# Patient Record
Sex: Female | Born: 1935 | Race: White | Hispanic: No | Smoking: Never smoker
Health system: Southern US, Community
[De-identification: ages and names within clinical notes are randomized; demographics above are authoritative.]

## PROBLEM LIST (undated history)

## (undated) DIAGNOSIS — Z5189 Encounter for other specified aftercare: Secondary | ICD-10-CM

## (undated) DIAGNOSIS — IMO0001 Reserved for inherently not codable concepts without codable children: Secondary | ICD-10-CM

## (undated) DIAGNOSIS — M869 Osteomyelitis, unspecified: Secondary | ICD-10-CM

## (undated) DIAGNOSIS — M1990 Unspecified osteoarthritis, unspecified site: Secondary | ICD-10-CM

## (undated) DIAGNOSIS — N289 Disorder of kidney and ureter, unspecified: Secondary | ICD-10-CM

## (undated) DIAGNOSIS — E785 Hyperlipidemia, unspecified: Secondary | ICD-10-CM

## (undated) HISTORY — PX: TONSILLECTOMY: SUR1361

## (undated) HISTORY — PX: BLADDER SURGERY: SHX569

## (undated) HISTORY — PX: TOTAL HIP ARTHROPLASTY: SHX124

## (undated) HISTORY — PX: EYE SURGERY: SHX253

## (undated) HISTORY — PX: JOINT REPLACEMENT: SHX530

## (undated) HISTORY — PX: TUBAL LIGATION: SHX77

## (undated) HISTORY — DX: Hyperlipidemia, unspecified: E78.5

---

## 2003-12-24 ENCOUNTER — Emergency Department (HOSPITAL_COMMUNITY): Admission: EM | Admit: 2003-12-24 | Discharge: 2003-12-24 | Payer: Self-pay | Admitting: Emergency Medicine

## 2004-01-01 ENCOUNTER — Emergency Department (HOSPITAL_COMMUNITY): Admission: EM | Admit: 2004-01-01 | Discharge: 2004-01-01 | Payer: Self-pay | Admitting: Emergency Medicine

## 2004-01-04 ENCOUNTER — Emergency Department (HOSPITAL_COMMUNITY): Admission: EM | Admit: 2004-01-04 | Discharge: 2004-01-04 | Payer: Self-pay | Admitting: Emergency Medicine

## 2004-08-04 ENCOUNTER — Ambulatory Visit: Payer: Self-pay | Admitting: Internal Medicine

## 2004-08-28 ENCOUNTER — Emergency Department (HOSPITAL_COMMUNITY): Admission: EM | Admit: 2004-08-28 | Discharge: 2004-08-28 | Payer: Self-pay | Admitting: Emergency Medicine

## 2004-11-15 ENCOUNTER — Ambulatory Visit: Payer: Self-pay | Admitting: Internal Medicine

## 2005-09-05 ENCOUNTER — Ambulatory Visit: Payer: Self-pay | Admitting: Internal Medicine

## 2006-04-12 ENCOUNTER — Ambulatory Visit: Payer: Self-pay | Admitting: Internal Medicine

## 2006-04-26 ENCOUNTER — Ambulatory Visit: Payer: Self-pay | Admitting: Internal Medicine

## 2006-08-29 ENCOUNTER — Ambulatory Visit: Payer: Self-pay | Admitting: Internal Medicine

## 2007-11-06 ENCOUNTER — Ambulatory Visit: Payer: Self-pay | Admitting: Internal Medicine

## 2007-11-06 DIAGNOSIS — J309 Allergic rhinitis, unspecified: Secondary | ICD-10-CM | POA: Insufficient documentation

## 2007-11-06 DIAGNOSIS — M948X9 Other specified disorders of cartilage, unspecified sites: Secondary | ICD-10-CM | POA: Insufficient documentation

## 2007-11-06 DIAGNOSIS — R631 Polydipsia: Secondary | ICD-10-CM

## 2007-11-08 LAB — CONVERTED CEMR LAB
BUN: 12 mg/dL (ref 6–23)
Basophils Absolute: 0 10*3/uL (ref 0.0–0.1)
Basophils Relative: 0.2 % (ref 0.0–1.0)
CO2: 36 meq/L — ABNORMAL HIGH (ref 19–32)
Calcium: 9.5 mg/dL (ref 8.4–10.5)
Chloride: 100 meq/L (ref 96–112)
Creatinine, Ser: 0.8 mg/dL (ref 0.4–1.2)
Eosinophils Absolute: 0 10*3/uL (ref 0.0–0.7)
Eosinophils Relative: 0.8 % (ref 0.0–5.0)
GFR calc Af Amer: 91 mL/min
GFR calc non Af Amer: 75 mL/min
Glucose, Bld: 135 mg/dL — ABNORMAL HIGH (ref 70–99)
HCT: 42.6 % (ref 36.0–46.0)
Hemoglobin: 14 g/dL (ref 12.0–15.0)
Lymphocytes Relative: 32.8 % (ref 12.0–46.0)
MCHC: 32.8 g/dL (ref 30.0–36.0)
MCV: 94.5 fL (ref 78.0–100.0)
Monocytes Absolute: 0.5 10*3/uL (ref 0.1–1.0)
Monocytes Relative: 8.7 % (ref 3.0–12.0)
Neutro Abs: 3.4 10*3/uL (ref 1.4–7.7)
Neutrophils Relative %: 57.5 % (ref 43.0–77.0)
Platelets: 269 10*3/uL (ref 150–400)
Potassium: 4 meq/L (ref 3.5–5.1)
RBC: 4.5 M/uL (ref 3.87–5.11)
RDW: 12.8 % (ref 11.5–14.6)
Sodium: 142 meq/L (ref 135–145)
TSH: 1.06 microintl units/mL (ref 0.35–5.50)
WBC: 5.8 10*3/uL (ref 4.5–10.5)

## 2008-06-30 ENCOUNTER — Ambulatory Visit: Payer: Self-pay | Admitting: Endocrinology

## 2008-07-04 ENCOUNTER — Encounter: Payer: Self-pay | Admitting: Endocrinology

## 2008-08-29 ENCOUNTER — Ambulatory Visit: Payer: Self-pay | Admitting: Internal Medicine

## 2008-08-29 DIAGNOSIS — M79609 Pain in unspecified limb: Secondary | ICD-10-CM

## 2008-08-29 DIAGNOSIS — M25569 Pain in unspecified knee: Secondary | ICD-10-CM

## 2008-08-29 DIAGNOSIS — H6120 Impacted cerumen, unspecified ear: Secondary | ICD-10-CM

## 2008-09-09 ENCOUNTER — Ambulatory Visit: Payer: Self-pay | Admitting: Internal Medicine

## 2008-09-09 DIAGNOSIS — N814 Uterovaginal prolapse, unspecified: Secondary | ICD-10-CM | POA: Insufficient documentation

## 2008-09-09 DIAGNOSIS — N760 Acute vaginitis: Secondary | ICD-10-CM | POA: Insufficient documentation

## 2008-10-06 ENCOUNTER — Telehealth: Payer: Self-pay | Admitting: Internal Medicine

## 2008-10-08 ENCOUNTER — Other Ambulatory Visit: Admission: RE | Admit: 2008-10-08 | Discharge: 2008-10-08 | Payer: Self-pay | Admitting: Obstetrics and Gynecology

## 2008-10-08 ENCOUNTER — Ambulatory Visit: Payer: Self-pay | Admitting: Obstetrics and Gynecology

## 2008-10-08 ENCOUNTER — Encounter: Payer: Self-pay | Admitting: Internal Medicine

## 2008-10-08 ENCOUNTER — Encounter: Payer: Self-pay | Admitting: Obstetrics and Gynecology

## 2008-10-10 ENCOUNTER — Ambulatory Visit: Payer: Self-pay | Admitting: Women's Health

## 2008-12-03 ENCOUNTER — Ambulatory Visit: Payer: Self-pay | Admitting: Women's Health

## 2009-01-26 ENCOUNTER — Ambulatory Visit: Payer: Self-pay | Admitting: Internal Medicine

## 2009-01-26 ENCOUNTER — Telehealth: Payer: Self-pay | Admitting: Internal Medicine

## 2009-01-26 DIAGNOSIS — R0989 Other specified symptoms and signs involving the circulatory and respiratory systems: Secondary | ICD-10-CM

## 2009-01-26 DIAGNOSIS — R5381 Other malaise: Secondary | ICD-10-CM | POA: Insufficient documentation

## 2009-01-26 DIAGNOSIS — R9431 Abnormal electrocardiogram [ECG] [EKG]: Secondary | ICD-10-CM

## 2009-01-26 DIAGNOSIS — R5383 Other fatigue: Secondary | ICD-10-CM

## 2009-01-26 DIAGNOSIS — R0609 Other forms of dyspnea: Secondary | ICD-10-CM | POA: Insufficient documentation

## 2009-01-27 ENCOUNTER — Ambulatory Visit: Payer: Self-pay | Admitting: Internal Medicine

## 2009-01-30 ENCOUNTER — Telehealth: Payer: Self-pay | Admitting: Internal Medicine

## 2009-01-30 ENCOUNTER — Ambulatory Visit: Payer: Self-pay | Admitting: Obstetrics and Gynecology

## 2009-02-13 ENCOUNTER — Encounter: Admission: RE | Admit: 2009-02-13 | Discharge: 2009-02-13 | Payer: Self-pay | Admitting: Chiropractic Medicine

## 2010-08-16 ENCOUNTER — Encounter
Admission: RE | Admit: 2010-08-16 | Discharge: 2010-08-16 | Payer: Self-pay | Source: Home / Self Care | Attending: Internal Medicine | Admitting: Internal Medicine

## 2010-08-21 ENCOUNTER — Encounter: Payer: Self-pay | Admitting: Internal Medicine

## 2010-08-29 LAB — CONVERTED CEMR LAB
BUN: 15 mg/dL (ref 6–23)
Basophils Absolute: 0 10*3/uL (ref 0.0–0.1)
Chloride: 100 meq/L (ref 96–112)
Eosinophils Absolute: 0.1 10*3/uL (ref 0.0–0.7)
GFR calc non Af Amer: 87.45 mL/min (ref >60)
Glucose, Bld: 92 mg/dL (ref 70–99)
HCT: 41.9 % (ref 36.0–46.0)
Lymphs Abs: 1.2 10*3/uL (ref 0.7–4.0)
MCHC: 35 g/dL (ref 30.0–36.0)
MCV: 93.1 fL (ref 78.0–100.0)
Monocytes Absolute: 0.4 10*3/uL (ref 0.1–1.0)
Platelets: 211 10*3/uL (ref 150.0–400.0)
Potassium: 3.3 meq/L — ABNORMAL LOW (ref 3.5–5.1)
RDW: 12.5 % (ref 11.5–14.6)
Sed Rate: 9 mm/hr (ref 0–22)
Specific Gravity, Urine: <=1.005 (ref 1.000–1.030)
TSH: 0.89 microintl units/mL (ref 0.35–5.50)
Total Bilirubin: 0.9 mg/dL (ref 0.3–1.2)
Urobilinogen, UA: 0.2 (ref 0.0–1.0)

## 2010-12-14 NOTE — Assessment & Plan Note (Signed)
[facility]                                 ON-CALL NOTE   Kendra Snyder, Kendra Snyder                   MRN:          [id number]  DATE:08/30/2008                            DOB:          08-28-1934    PHONE NUMBER:  [phone].   Dr. Posey Rea and Dr. Ladona Ridgel on call.   CHIEF COMPLAINT:  Ear problem.   The patient states she saw Dr. Posey Rea for ear problem yesterday.  He  brought out some of ear wax and that helped quite a bit.  He gave her a  prescription for Corticosporin ear drops, but she refuses to use it,  because she thinks she is allergic to all antibiotics.  This morning,  her ear has been bothering her a bit for a small amount of time, 15-20  minutes, the front side of her face felt somewhat numb.  She rubbed it  and it got better fairly quickly.  She said she has not had any loss of  motor control and dropping, problems with speech, headache, or any other  symptoms whatsoever.  She has had sinus problems lately but those are  much better.  I advised her that if her numbness returns and if she  notices any weakness or drooping in her face or if she has speech  problems or any other neurologic symptoms, she needs to go to the  emergency room for evaluation.  Otherwise, we will continue to watch  closely this again.  I advised her to call back or seek care if she  develops a fever or her ear pain becomes severe, and otherwise to call  Dr. Loren Racer office for a followup on Monday.     Marne A. Tower, MD  Electronically Signed    MAT/MedQ  DD: 08/30/2008  DT: 08/31/2008  Job #: [id number]

## 2010-12-17 NOTE — Assessment & Plan Note (Signed)
[facility]                           PRIMARY CARE OFFICE NOTE   Kendra Snyder, Kendra Snyder                   MRN:          [id number]  DATE:08/29/2006                            DOB:          1935-02-15    PROCEDURE:  Left ear irrigation.   INDICATIONS FOR PROCEDURE:  Severe wax impaction.   The procedure was discussed with the patient in detail and she agrees to  proceed.   The ear was irrigated with lukewarm water. After a while a large amount  of wax was recovered. Tolerated well.   COMPLICATIONS:  None. Hearing restored.     Georgina Quint. Plotnikov, MD  Electronically Signed    AVP/MedQ  DD: 08/30/2006  DT: 08/30/2006  Job #: [id number]

## 2011-01-14 ENCOUNTER — Inpatient Hospital Stay (HOSPITAL_COMMUNITY)
Admission: AD | Admit: 2011-01-14 | Discharge: 2011-01-14 | Disposition: A | Discharge status: Home / Self Care | Payer: Medicare Other | Source: Ambulatory Visit | Attending: Obstetrics and Gynecology | Admitting: Obstetrics and Gynecology

## 2011-01-14 DIAGNOSIS — Z96649 Presence of unspecified artificial hip joint: Secondary | ICD-10-CM | POA: Insufficient documentation

## 2011-01-14 DIAGNOSIS — R339 Retention of urine, unspecified: Secondary | ICD-10-CM | POA: Insufficient documentation

## 2011-01-14 LAB — URINE MICROSCOPIC-ADD ON

## 2011-01-14 LAB — URINALYSIS, ROUTINE W REFLEX MICROSCOPIC
Glucose, UA: NEGATIVE mg/dL
Hgb urine dipstick: NEGATIVE
Ketones, ur: NEGATIVE mg/dL
Protein, ur: NEGATIVE mg/dL
pH: 7 (ref 5.0–8.0)

## 2011-04-01 ENCOUNTER — Encounter: Payer: Self-pay | Admitting: Emergency Medicine

## 2011-04-01 ENCOUNTER — Emergency Department (HOSPITAL_BASED_OUTPATIENT_CLINIC_OR_DEPARTMENT_OTHER)
Admission: EM | Admit: 2011-04-01 | Discharge: 2011-04-01 | Disposition: A | Discharge status: Home / Self Care | Payer: Medicare Other | Attending: Emergency Medicine | Admitting: Emergency Medicine

## 2011-04-01 DIAGNOSIS — R339 Retention of urine, unspecified: Secondary | ICD-10-CM | POA: Insufficient documentation

## 2011-04-01 DIAGNOSIS — N8111 Cystocele, midline: Secondary | ICD-10-CM | POA: Insufficient documentation

## 2011-04-01 DIAGNOSIS — I10 Essential (primary) hypertension: Secondary | ICD-10-CM | POA: Insufficient documentation

## 2011-04-01 DIAGNOSIS — N8110 Cystocele, unspecified: Secondary | ICD-10-CM

## 2011-04-01 HISTORY — DX: Disorder of kidney and ureter, unspecified: N28.9

## 2011-04-01 HISTORY — DX: Encounter for other specified aftercare: Z51.89

## 2011-04-01 HISTORY — DX: Reserved for inherently not codable concepts without codable children: IMO0001

## 2011-04-01 HISTORY — DX: Osteomyelitis, unspecified: M86.9

## 2011-04-01 LAB — URINALYSIS, ROUTINE W REFLEX MICROSCOPIC
Glucose, UA: NEGATIVE mg/dL
Ketones, ur: NEGATIVE mg/dL
Nitrite: NEGATIVE
Protein, ur: NEGATIVE mg/dL
Urobilinogen, UA: 0.2 mg/dL (ref 0.0–1.0)

## 2011-04-01 LAB — DIFFERENTIAL
Basophils Absolute: 0 10*3/uL (ref 0.0–0.1)
Basophils Relative: 1 % (ref 0–1)
Eosinophils Absolute: 0.1 10*3/uL (ref 0.0–0.7)
Eosinophils Relative: 1 % (ref 0–5)
Monocytes Absolute: 0.6 10*3/uL (ref 0.1–1.0)

## 2011-04-01 LAB — CBC
HCT: 40.4 % (ref 36.0–46.0)
MCH: 31.8 pg (ref 26.0–34.0)
MCHC: 35.1 g/dL (ref 30.0–36.0)
MCV: 90.6 fL (ref 78.0–100.0)
Platelets: 231 10*3/uL (ref 150–400)
RDW: 13.1 % (ref 11.5–15.5)
WBC: 6.3 10*3/uL (ref 4.0–10.5)

## 2011-04-01 LAB — URINE MICROSCOPIC-ADD ON

## 2011-04-01 LAB — BASIC METABOLIC PANEL
CO2: 32 mEq/L (ref 19–32)
Calcium: 9.5 mg/dL (ref 8.4–10.5)
Creatinine, Ser: 0.5 mg/dL (ref 0.50–1.10)
GFR calc non Af Amer: >60 mL/min (ref >60)
Sodium: 142 mEq/L (ref 135–145)

## 2011-04-01 NOTE — ED Provider Notes (Signed)
History     CSN: [id number] Arrival date & time: 04/01/2011  7:04 PM  Chief Complaint  Patient presents with  . Urinary Retention    Patient states she has a dropped bladder.    HPI Comments: 34yoF h/o bladder prolapse pw urinary retention. Per patient she usually wears a pessary, it was removed by her OBGYN 5 days ago 2/2 vaginal sores. States she began to have lower abdominal pressure and incomplete voiding which began yesterday. She denies back pain, abdominal pain/n/v/fever/chills. Denies other complaints at this time. Seen at [facility] for similar in June with retention relieved by pessary replacement. Last urinated here before my history and physical. States she's worried about her kidneys. Denies hematuria/dysuria. Denies vaginal discharge/bleeding/irritation       Kendra Snyder 04/01/2011 18:52     Patient states she is having problems with her pessarry. States that her doctor has ordered her a new one at a different size but has not received it yet.         Past Medical History  Diagnosis Date  . Hypertension   . Renal disorder   . Blood transfusion   . Osteomyelitis     Past Surgical History  Procedure Date  . Bladder surgery   . Total hip arthroplasty   . Tubal ligation   . Eye surgery   . Tonsillectomy     No family history on file.  History  Substance Use Topics  . Smoking status: Never Smoker   . Smokeless tobacco: Not on file  . Alcohol Use: No    OB History    Grav Para Term Preterm Abortions TAB SAB Ect Mult Living                  Review of Systems  All other systems reviewed and are negative.  except as noted HPI   Physical Exam  BP 154/73  Pulse 85  Temp(Src) 98.8 F (37.1 C) (Oral)  Resp 18  Ht 4\' 10"  (1.473 m)  Wt 121 lb (54.885 kg)  BMI 25.29 kg/m2  SpO2 99%  Physical Exam  Nursing note and vitals reviewed. Constitutional: She is oriented to person, place, and time. She appears well-developed.  HENT:  Head: Atraumatic.    Mouth/Throat: Oropharynx is clear and moist.  Eyes: Conjunctivae and EOM are normal. Pupils are equal, round, and reactive to light.  Neck: Normal range of motion. Neck supple.  Cardiovascular: Normal rate, regular rhythm, normal heart sounds and intact distal pulses.   Pulmonary/Chest: Effort normal and breath sounds normal. No respiratory distress. She has no wheezes. She has no rales.  Abdominal: Soft. She exhibits no distension. There is tenderness. There is no rebound and no guarding.       Mild suprapubic ttp No CVAT  Musculoskeletal: Normal range of motion.  Neurological: She is alert and oriented to person, place, and time.  Skin: Skin is warm and dry. No rash noted.  Psychiatric: She has a normal mood and affect.   Results for orders placed during the hospital encounter of 04/01/11  URINALYSIS, ROUTINE W REFLEX MICROSCOPIC      Component Value Range   Color, Urine YELLOW  YELLOW    Appearance CLOUDY (*) CLEAR    Specific Gravity, Urine 1.009  1.005 - 1.030    pH 7.5  5.0 - 8.0    Glucose, UA NEGATIVE  NEGATIVE (mg/dL)   Hgb urine dipstick NEGATIVE  NEGATIVE    Bilirubin Urine NEGATIVE  NEGATIVE  Ketones, ur NEGATIVE  NEGATIVE (mg/dL)   Protein, ur NEGATIVE  NEGATIVE (mg/dL)   Urobilinogen, UA 0.2  0.0 - 1.0 (mg/dL)   Nitrite NEGATIVE  NEGATIVE    Leukocytes, UA MODERATE (*) NEGATIVE   URINE MICROSCOPIC-ADD ON      Component Value Range   Squamous Epithelial / LPF FEW (*) RARE    WBC, UA 7-10  <3 (WBC/hpf)   Bacteria, UA FEW (*) RARE   CBC      Component Value Range   WBC 6.3  4.0 - 10.5 (K/uL)   RBC 4.46  3.87 - 5.11 (MIL/uL)   Hemoglobin 14.2  12.0 - 15.0 (g/dL)   HCT 16.1  09.6 - 04.5 (%)   MCV 90.6  78.0 - 100.0 (fL)   MCH 31.8  26.0 - 34.0 (pg)   MCHC 35.1  30.0 - 36.0 (g/dL)   RDW 40.9  81.1 - 91.4 (%)   Platelets 231  150 - 400 (K/uL)  DIFFERENTIAL      Component Value Range   Neutrophils Relative 70  43 - 77 (%)   Neutro Abs 4.4  1.7 - 7.7 (K/uL)    Lymphocytes Relative 20  12 - 46 (%)   Lymphs Abs 1.3  0.7 - 4.0 (K/uL)   Monocytes Relative 9  3 - 12 (%)   Monocytes Absolute 0.6  0.1 - 1.0 (K/uL)   Eosinophils Relative 1  0 - 5 (%)   Eosinophils Absolute 0.1  0.0 - 0.7 (K/uL)   Basophils Relative 1  0 - 1 (%)   Basophils Absolute 0.0  0.0 - 0.1 (K/uL)     ED Course  Procedures  MDM 76yoF h/o prolapsed bladder pw inability to completely void x 2 days after pessary removal by her GYN for vaginal lesions. Will check Cr, U/A. She is urinating here. Reassess  Kendra Gaul, MD  9:49 PM  Labs reviewed and unremarkable. +LE and only 7-10 WBC, +Squams. Will not treat at this time. Pt with 200cc post void residual. Feeling better. Refusing foley catheter until GYN f/u. States she's better here and does not want anything else done. Will call her GYN for earlier appt. Precautions for return.    Kendra Cellar, MD 04/01/11 2152

## 2011-04-01 NOTE — ED Notes (Signed)
Straight cath performed using aseptic technique to assess post cath residual. Pt had urinated prior to procedure. approx returned.

## 2011-04-01 NOTE — ED Notes (Signed)
Discharge condition entered in on wrong patient.

## 2011-04-01 NOTE — ED Notes (Signed)
Patient states she is having problems with her pessarry. States that her doctor has ordered her a new one at a different size but has not received it yet.

## 2011-04-03 LAB — URINE CULTURE

## 2013-09-28 ENCOUNTER — Encounter (HOSPITAL_COMMUNITY): Payer: Self-pay | Admitting: Emergency Medicine

## 2013-09-28 ENCOUNTER — Inpatient Hospital Stay (HOSPITAL_COMMUNITY)
Admission: EM | Admit: 2013-09-28 | Discharge: 2013-10-06 | DRG: 470 | Disposition: A | Discharge status: Skilled Nursing Facility | Payer: Medicare HMO | Attending: Internal Medicine | Admitting: Internal Medicine

## 2013-09-28 ENCOUNTER — Emergency Department (HOSPITAL_COMMUNITY): Payer: Medicare HMO

## 2013-09-28 DIAGNOSIS — Z9181 History of falling: Secondary | ICD-10-CM

## 2013-09-28 DIAGNOSIS — M169 Osteoarthritis of hip, unspecified: Principal | ICD-10-CM | POA: Diagnosis present

## 2013-09-28 DIAGNOSIS — M79609 Pain in unspecified limb: Secondary | ICD-10-CM | POA: Diagnosis present

## 2013-09-28 DIAGNOSIS — R5383 Other fatigue: Secondary | ICD-10-CM

## 2013-09-28 DIAGNOSIS — M1612 Unilateral primary osteoarthritis, left hip: Secondary | ICD-10-CM | POA: Diagnosis present

## 2013-09-28 DIAGNOSIS — M25559 Pain in unspecified hip: Secondary | ICD-10-CM | POA: Diagnosis present

## 2013-09-28 DIAGNOSIS — R531 Weakness: Secondary | ICD-10-CM | POA: Diagnosis present

## 2013-09-28 DIAGNOSIS — R195 Other fecal abnormalities: Secondary | ICD-10-CM | POA: Diagnosis present

## 2013-09-28 DIAGNOSIS — E8809 Other disorders of plasma-protein metabolism, not elsewhere classified: Secondary | ICD-10-CM | POA: Diagnosis present

## 2013-09-28 DIAGNOSIS — R609 Edema, unspecified: Secondary | ICD-10-CM | POA: Diagnosis present

## 2013-09-28 DIAGNOSIS — R5381 Other malaise: Secondary | ICD-10-CM | POA: Diagnosis present

## 2013-09-28 DIAGNOSIS — M25552 Pain in left hip: Secondary | ICD-10-CM | POA: Diagnosis present

## 2013-09-28 DIAGNOSIS — M25569 Pain in unspecified knee: Secondary | ICD-10-CM | POA: Diagnosis present

## 2013-09-28 DIAGNOSIS — M25562 Pain in left knee: Secondary | ICD-10-CM | POA: Diagnosis present

## 2013-09-28 DIAGNOSIS — M899 Disorder of bone, unspecified: Secondary | ICD-10-CM | POA: Diagnosis present

## 2013-09-28 DIAGNOSIS — M8580 Other specified disorders of bone density and structure, unspecified site: Secondary | ICD-10-CM | POA: Diagnosis present

## 2013-09-28 DIAGNOSIS — R600 Localized edema: Secondary | ICD-10-CM | POA: Diagnosis present

## 2013-09-28 DIAGNOSIS — Z91041 Radiographic dye allergy status: Secondary | ICD-10-CM

## 2013-09-28 DIAGNOSIS — Z96649 Presence of unspecified artificial hip joint: Secondary | ICD-10-CM

## 2013-09-28 DIAGNOSIS — Z880 Allergy status to penicillin: Secondary | ICD-10-CM

## 2013-09-28 DIAGNOSIS — N390 Urinary tract infection, site not specified: Secondary | ICD-10-CM | POA: Diagnosis present

## 2013-09-28 DIAGNOSIS — M949 Disorder of cartilage, unspecified: Secondary | ICD-10-CM

## 2013-09-28 DIAGNOSIS — E876 Hypokalemia: Secondary | ICD-10-CM | POA: Diagnosis present

## 2013-09-28 DIAGNOSIS — M1610 Unilateral primary osteoarthritis, unspecified hip: Principal | ICD-10-CM | POA: Diagnosis present

## 2013-09-28 DIAGNOSIS — Z881 Allergy status to other antibiotic agents status: Secondary | ICD-10-CM

## 2013-09-28 DIAGNOSIS — W19XXXA Unspecified fall, initial encounter: Secondary | ICD-10-CM | POA: Diagnosis present

## 2013-09-28 HISTORY — DX: Unspecified osteoarthritis, unspecified site: M19.90

## 2013-09-28 LAB — URINALYSIS, ROUTINE W REFLEX MICROSCOPIC
Bilirubin Urine: NEGATIVE
Glucose, UA: NEGATIVE mg/dL
Hgb urine dipstick: NEGATIVE
KETONES UR: NEGATIVE mg/dL
NITRITE: NEGATIVE
PROTEIN: NEGATIVE mg/dL
Specific Gravity, Urine: 1.005 (ref 1.005–1.030)
UROBILINOGEN UA: 0.2 mg/dL (ref 0.0–1.0)
pH: 7 (ref 5.0–8.0)

## 2013-09-28 LAB — CBC WITH DIFFERENTIAL/PLATELET
BASOS ABS: 0 10*3/uL (ref 0.0–0.1)
Basophils Relative: 0 % (ref 0–1)
EOS PCT: 0 % (ref 0–5)
Eosinophils Absolute: 0 10*3/uL (ref 0.0–0.7)
HCT: 37.7 % (ref 36.0–46.0)
Hemoglobin: 12.9 g/dL (ref 12.0–15.0)
LYMPHS PCT: 12 % (ref 12–46)
Lymphs Abs: 0.9 10*3/uL (ref 0.7–4.0)
MCH: 30.9 pg (ref 26.0–34.0)
MCHC: 34.2 g/dL (ref 30.0–36.0)
MCV: 90.2 fL (ref 78.0–100.0)
Monocytes Absolute: 0.9 10*3/uL (ref 0.1–1.0)
Monocytes Relative: 12 % (ref 3–12)
Neutro Abs: 5.7 10*3/uL (ref 1.7–7.7)
Neutrophils Relative %: 75 % (ref 43–77)
PLATELETS: 277 10*3/uL (ref 150–400)
RBC: 4.18 MIL/uL (ref 3.87–5.11)
RDW: 13.2 % (ref 11.5–15.5)
WBC: 7.6 10*3/uL (ref 4.0–10.5)

## 2013-09-28 LAB — COMPREHENSIVE METABOLIC PANEL
ALBUMIN: 3.4 g/dL — AB (ref 3.5–5.2)
ALT: 17 U/L (ref 0–35)
AST: 22 U/L (ref 0–37)
Alkaline Phosphatase: 115 U/L (ref 39–117)
BILIRUBIN TOTAL: 0.4 mg/dL (ref 0.3–1.2)
BUN: 19 mg/dL (ref 6–23)
CALCIUM: 9.6 mg/dL (ref 8.4–10.5)
CO2: 29 meq/L (ref 19–32)
Chloride: 100 mEq/L (ref 96–112)
Creatinine, Ser: 0.56 mg/dL (ref 0.50–1.10)
GFR calc non Af Amer: 88 mL/min — ABNORMAL LOW (ref >90)
Glucose, Bld: 111 mg/dL — ABNORMAL HIGH (ref 70–99)
Potassium: 3.4 mEq/L — ABNORMAL LOW (ref 3.7–5.3)
SODIUM: 140 meq/L (ref 137–147)
TOTAL PROTEIN: 6.4 g/dL (ref 6.0–8.3)

## 2013-09-28 LAB — TROPONIN I

## 2013-09-28 LAB — URINE MICROSCOPIC-ADD ON

## 2013-09-28 LAB — CK: Total CK: 162 U/L (ref 7–177)

## 2013-09-28 MED ORDER — KETOROLAC TROMETHAMINE 15 MG/ML IJ SOLN
15.0000 mg | Freq: Once | INTRAMUSCULAR | Status: AC
  Administered 2013-09-28: 15 mg via INTRAVENOUS
  Filled 2013-09-28: qty 1

## 2013-09-28 MED ORDER — ENOXAPARIN SODIUM 40 MG/0.4ML ~~LOC~~ SOLN
40.0000 mg | Freq: Every day | SUBCUTANEOUS | Status: DC
  Administered 2013-09-28 – 2013-09-29 (×2): 40 mg via SUBCUTANEOUS
  Filled 2013-09-28 (×3): qty 0.4

## 2013-09-28 MED ORDER — POTASSIUM CHLORIDE CRYS ER 20 MEQ PO TBCR
30.0000 meq | EXTENDED_RELEASE_TABLET | Freq: Once | ORAL | Status: AC
  Administered 2013-09-28: 30 meq via ORAL
  Filled 2013-09-28: qty 1

## 2013-09-28 MED ORDER — CIPROFLOXACIN IN D5W 400 MG/200ML IV SOLN
400.0000 mg | Freq: Two times a day (BID) | INTRAVENOUS | Status: DC
  Administered 2013-09-28 – 2013-10-02 (×8): 400 mg via INTRAVENOUS
  Filled 2013-09-28 (×10): qty 200

## 2013-09-28 MED ORDER — CIPROFLOXACIN IN D5W 400 MG/200ML IV SOLN
400.0000 mg | Freq: Once | INTRAVENOUS | Status: AC
Start: 2013-09-28 — End: 2013-09-28
  Administered 2013-09-28: 400 mg via INTRAVENOUS
  Filled 2013-09-28: qty 200

## 2013-09-28 MED ORDER — SODIUM CHLORIDE 0.9 % IV BOLUS (SEPSIS)
500.0000 mL | Freq: Once | INTRAVENOUS | Status: AC
  Administered 2013-09-28: 500 mL via INTRAVENOUS

## 2013-09-28 NOTE — ED Notes (Addendum)
EMS reports pt called due to pain all over her body due to arthritis, no other complaints. Pt would benefit for social work consult due to living by herself, she has home health but thinks they can only come to assist M-F.

## 2013-09-28 NOTE — ED Notes (Signed)
Bed: [id number] Expected date:  Expected time:  Means of arrival:  Comments: EMS Rheumatoid arthritis pain

## 2013-09-28 NOTE — ED Notes (Signed)
MD Sheldon at bedside.  

## 2013-09-28 NOTE — Evaluation (Signed)
Physical Therapy Evaluation Patient Details Name: Kendra Snyder MRN: [id number] DOB: 01/03/1935 Today's Date: 09/28/2013 Time: [id number] PT Time Calculation (min): 45 min  PT Assessment / Plan / Recommendation History of Present Illness  fall at home  Clinical Impression  Pt with large amount of incontinent urine in bed.  C/o L shoulder pain with minimal ROM. Pt is very limited in mobility and c/o pain of legs. Pt will benefit from PT to address problems listed.    PT Assessment  Patient needs continued PT services    Follow Up Recommendations  SNF;Supervision/Assistance - 24 hour    Does the patient have the potential to tolerate intense rehabilitation      Barriers to Discharge Decreased caregiver support      Equipment Recommendations  None recommended by PT    Recommendations for Other Services     Frequency Min 3X/week    Precautions / Restrictions Precautions Precaution Comments: incontinent, large amounts, use depends if available   Pertinent Vitals/Pain Legs are painful, L shoulder very painful .      Mobility  Bed Mobility Overal bed mobility: +2 for physical assistance;+ 2 for safety/equipment;Needs Assistance Bed Mobility: Rolling;Supine to Sit;Sit to Supine Rolling: +2 for physical assistance;+2 for safety/equipment;Total assist Supine to sit: Total assist;+2 for physical assistance;+2 for safety/equipment Sit to supine: Total assist;+2 for safety/equipment;+2 for physical assistance General bed mobility comments: pt moves  as a unit, very rigid in legs and trunk. Transfers General transfer comment: unable to attempt, bed too high and pt not  functioning well enough.    Exercises     PT Diagnosis: Generalized weakness;Acute pain  PT Problem List: Decreased strength;Decreased range of motion;Decreased activity tolerance;Decreased balance;Decreased mobility;Pain;Decreased knowledge of use of DME;Decreased safety awareness;Decreased knowledge of  precautions PT Treatment Interventions: DME instruction;Functional mobility training;Therapeutic activities;Therapeutic exercise;Patient/family education     PT Goals(Current goals can be found in the care plan section) Acute Rehab PT Goals Patient Stated Goal: i want to go home PT Goal Formulation: With patient Time For Goal Achievement: 10/12/13 Potential to Achieve Goals: Fair  Visit Information  Last PT Received On: 09/28/13 Assistance Needed: +2 History of Present Illness: fall at home       Prior Functioning  Home Living Family/patient expects to be discharged to:: Skilled nursing facility Living Arrangements: Alone Prior Function Level of Independence: Needs assistance Gait / Transfers Assistance Needed: got around in apt on rollator, fell from it. Comments: apppears to be marginally taking care of self, pt reports community supports her,    Cognition  Cognition Arousal/Alertness: Awake/alert Behavior During Therapy: WFL for tasks assessed/performed Overall Cognitive Status: Difficult to assess    Extremity/Trunk Assessment Upper Extremity Assessment Upper Extremity Assessment: LUE deficits/detail LUE Deficits / Details: severe  functional limits of shoulder elevation, could not tolerate movung arm away from her side. Lower Extremity Assessment Lower Extremity Assessment: RLE deficits/detail;LLE deficits/detail RLE Deficits / Details: reddened feet/legs, hyper sensitive LLE Deficits / Details: decreased knee flexion tolerated., noted very reddened feet and up legs, decreased knee flexion tolerated. Cervical / Trunk Assessment Cervical / Trunk Assessment: Kyphotic   Balance Balance Overall balance assessment: Needs assistance Sitting-balance support: Feet unsupported;No upper extremity supported Sitting balance-Leahy Scale: Poor Postural control: Posterior lean;Left lateral lean  End of Session PT - End of Session Activity Tolerance: Patient limited by  pain Patient left: in bed;with call bell/phone within reach;with family/visitor present Nurse Communication: Mobility status  GP     Rada HayHill, Lindsey Demonte Elizabeth 09/28/2013, 4:50  PM   

## 2013-09-28 NOTE — ED Notes (Signed)
Pt transported to CT ?

## 2013-09-28 NOTE — ED Notes (Signed)
Pt reports that she fell last pm around 11pm. She is having pain all over and pain in the left upper arm with movement. She is not c/o any additional pain from the fall. She states that she was on the floor from her fall for 4 hours until she was able to reach her cell phone to call a neighbor for help. Pt has bilateral lower extremity swelling and reports that has been present x 6 months.

## 2013-09-28 NOTE — ED Provider Notes (Signed)
CSN: [id number]     Arrival date & time 09/28/13  0720 History   First MD Initiated Contact with Patient 09/28/13 [phone]     Chief Complaint  Patient presents with  . Arthritis  . Fall     (Consider location/radiation/quality/duration/timing/severity/associated sxs/prior Treatment) Patient is a 79 y.o. female presenting with arthritis and fall.  Arthritis  Fall   Level 5 caveat due to vague historian Pt with history of arthritis (unsure what kind) reports she had a fall last night around 11pm. She was on the floor for several hours until she was able to call a neighbor who helped her onto her couch. A short time later, she tried to get up and fell again. Spent several more hours on the floor before neighbor came back and helped her up again. At some point EMS was called but it isn't clear who called or why. The patient states she is constantly in pain, but denies any new injuries from her fall. She has refused any medications or consideration of surgery to help with her arthritis in the past but has 'a group' that comes to her house to help her. I'm not clear exactly what services this group provides but seems to be physical therapy of some sort and maybe home health nursing as well. Pt is unable to further describe what they do except that they got her a bedside commode and were working on getting her a motorized wheelchair. In the ED today she 'just wants to be able to walk again so she can go home' but she recognizes she won't be able to go home if she is unable to stand and walk safely.   Past Medical History  Diagnosis Date  . Hypertension   . Renal disorder   . Blood transfusion   . Osteomyelitis   . Arthritis    Past Surgical History  Procedure Laterality Date  . Bladder surgery    . Total hip arthroplasty    . Tubal ligation    . Eye surgery    . Tonsillectomy     No family history on file. History  Substance Use Topics  . Smoking status: Never Smoker   . Smokeless  tobacco: Not on file  . Alcohol Use: No   OB History   Grav Para Term Preterm Abortions TAB SAB Ect Mult Living                 Review of Systems  Musculoskeletal: Positive for arthritis.   All other systems reviewed and are negative except as noted in HPI.     Allergies  Other; Amoxicillin; Iohexol; and Penicillins  Home Medications   Current Outpatient Rx  Name  Route  Sig  Dispense  Refill  . OVER THE COUNTER MEDICATION   Oral   Take by mouth daily. Shakley Vitamins: calcium, D3, meal shake, vitalizer (multivitamins), fish oil           BP 129/55  Pulse 80  Temp(Src) 97.9 F (36.6 C) (Oral)  Resp 19  SpO2 99% Physical Exam  Nursing note and vitals reviewed. Constitutional: She is oriented to person, place, and time. She appears well-developed and well-nourished.  HENT:  Head: Normocephalic and atraumatic.  Dry mouth  Eyes: EOM are normal. Pupils are equal, round, and reactive to light.  Neck: Normal range of motion. Neck supple.  Cardiovascular: Normal rate, normal heart sounds and intact distal pulses.   Pulmonary/Chest: Effort normal and breath sounds normal.  Abdominal: Bowel sounds  are normal. She exhibits no distension. There is no tenderness.  Musculoskeletal: She exhibits edema (3+ edema of bilateral LE to knees) and tenderness.  Tender with ROM of many joints, particularly L hip  Neurological: She is alert and oriented to person, place, and time. She has normal strength. No cranial nerve deficit or sensory deficit.  Skin: Skin is warm and dry. No rash noted.  Psychiatric: She has a normal mood and affect.    ED Course  Procedures (including critical care time) Labs Review Labs Reviewed  COMPREHENSIVE METABOLIC PANEL - Abnormal; Notable for the following:    Potassium 3.4 (*)    Glucose, Bld 111 (*)    Albumin 3.4 (*)    GFR calc non Af Amer 88 (*)    All other components within normal limits  URINALYSIS, ROUTINE W REFLEX MICROSCOPIC -  Abnormal; Notable for the following:    Leukocytes, UA MODERATE (*)    All other components within normal limits  URINE MICROSCOPIC-ADD ON - Abnormal; Notable for the following:    Bacteria, UA MANY (*)    All other components within normal limits  CBC WITH DIFFERENTIAL  TROPONIN I  CK   Imaging Review Dg Hip Complete Left  09/28/2013   CLINICAL DATA:  Left hip pain, fall  EXAM: LEFT HIP - COMPLETE 2+ VIEW  COMPARISON:  02/13/2009  FINDINGS: Three views of the left hip submitted. There is diffuse osteopenia. Again noted right hip prosthesis. No definite acute fracture is identified. There is significant progression of degenerative changes. Significant narrowing of superior left hip joint space. Cystic and sclerotic changes are noted superior acetabulum. Sclerotic changes are noted superior aspect of femoral head. There is collapse and remodeling with partial impaction of superior aspect of femoral head of indeterminate age. Clinical correlation is necessary, further correlation with MRI could be performed as clinically warranted.  IMPRESSION: There is diffuse osteopenia. Again noted right hip prosthesis. No definite acute fracture is identified. There is significant progression of degenerative changes. Significant narrowing of superior left hip joint space. Cystic and sclerotic changes are noted superior acetabulum. Sclerotic changes are noted superior aspect of femoral head. There is collapse and remodeling with partial impaction of superior aspect of femoral head of indeterminate age. Clinical correlation is necessary, further correlation with MRI could be performed as clinically warranted.   Electronically Signed   By: Natasha MeadLiviu  Pop M.D.   On: 09/28/2013 09:07   Ct Head Wo Contrast  09/28/2013   CLINICAL DATA:  Larey SeatFell 11 p.m. yesterday, head pain  EXAM: CT HEAD WITHOUT CONTRAST  TECHNIQUE: Contiguous axial images were obtained from the base of the skull through the vertex without intravenous contrast.   COMPARISON:  CT HEAD W/O CM dated 08/16/2010; DG CERVICAL SPINE 2-3 VIEWS dated 02/13/2009  FINDINGS: Stable mild atrophy. No evidence of hemorrhage, infarct, or mass. No mass effect or hydrocephalus. Calvarium is intact.  IMPRESSION: No significant change from prior study.  Age-related atrophy.   Electronically Signed   By: Esperanza Heiraymond  Rubner M.D.   On: 09/28/2013 08:18   Dg Humerus Left  09/28/2013   CLINICAL DATA:  Pain post fall  EXAM: LEFT HUMERUS - 2+ VIEW  COMPARISON:  None.  FINDINGS: Advanced degenerative changes at the glenohumeral articulation with remodeling of the humeral head. Mild diffuse osteopenia. Negative for fracture, dislocation, or other acute bone abnormality.  IMPRESSION: 1. Negative for fracture or other acute bone abnormality. 2. Glenohumeral degenerative changes as above.   Electronically Signed  By: Oley Balm M.D.   On: 09/28/2013 11:12     EKG Interpretation   Date/Time:  Saturday September 28 2013 08:16:23 EST Ventricular Rate:  76 PR Interval:  140 QRS Duration: 77 QT Interval:  391 QTC Calculation: 440 R Axis:   42 Text Interpretation:  Sinus rhythm Baseline wander in lead(s) V2 No  significant change since last tracing Confirmed by [facility]  MD, Leonette Most  [id number]) on 09/28/2013 8:31:33 AM      MDM   Final diagnoses:  UTI (urinary tract infection)  Fall  Osteoarthritis of left hip    Pt with neg imaging and labs as above, except for mild UTI. Care Manager has evaluated the patient, recommends PT eval in the ED as she says she cannot walk. Will discuss with Hospitalist as well.    Charles B. Bernette Mayers, MD 09/28/13 1447

## 2013-09-28 NOTE — Progress Notes (Signed)
   CARE MANAGEMENT NOTE 09/28/2013  Patient:  Kendra Snyder,Kendra Snyder   Account Number:  [id number]  Date Initiated:  09/28/2013  [facility]ation [facility]:  [facility]  Subjective/Objective Assessment:   mechanical fall     Action/Plan:   lives at home alone   Anticipated DC Date:  09/28/2013   Anticipated DC Plan:  HOME W HOME HEALTH SERVICES      DC Planning Services  CM consult      Choice offered to / List presented to:     DME arranged  [facility]      DME agency  [facility]        Status of service:  Completed, signed off Medicare Important Message given?   (If response is "NO", the following Medicare IM given date fields will be blank) Date Medicare IM given:   Date Additional Medicare IM given:    Discharge Disposition:  HOME W HOME HEALTH SERVICES  Per UR Regulation:    If discussed at Long Length of Stay Meetings, dates discussed:    Comments:  09/28/2013 1130 NCM spoke to pt and states she lives at home alone. She has a group of people that come into the home but she is not sure what program or agency. She gets [employer] Wheels, [name]. Her dtr, Kendra Snyder # [phone] [place]. Gave permission to call dtr, or friends. Left message for dtr. Contacted Kendra Snyder [phone]. States she will see her on tomorrow at her home. She did not have any information on caregivers. She will call dtr to make aware of pt's situation. Pt wanted NCM to call Kendra Snyder, # [phone]. She states her brakes on her Rollator is not working. She is requesting another Rollator. States organization working with her at home are trying to get her a motorized wheelchair. Explained Rollator out of pocket is $145. Contacted Kendra Snyder and he can bring money for Rollator to use at home. She also has a neighbor, Kendra Snyder that assist with helping her get her groceries and meds.  Notified MD of updates and PT ordered to evaluate pt for services needed at home.  Kendra DonningAlesia Yaiza Palazzola RN CCM Case Mgmt phone [phone]

## 2013-09-28 NOTE — H&P (Addendum)
History and Physical    Kendra Snyder XBM:[id number] DOB: 07/03/1935 DOA: 09/28/2013  Referring physician: Dr. Bernette MayersSheldon PCP: Sonda PrimesAlex Plotnikov, MD  Specialists: none  Chief Complaint: Weakness  HPI: Kendra Snyder is a 79 y.o. female who does not followup with her doctor regularly, past medical history of remote right hip replacement, complicated by osteomyelitis, presents to the emergency room following a fall. She had a fall last night, was barely able to get up and then fell again. She apparently was on the floor for several hours before EMS was called. She denies any chest pain, shortness of breath, she denies any lightheadedness or dizziness. She endorses generalized weakness and generalized pain. She is now gone her pain, nausea, vomiting or diarrhea. She has no fever or chills.  Patient initially reluctant to be admitted.  She is a very very poor historian and offers little to no information even when asked directly about her past medical history or previous medications. Answers tangentially, laughs inappropriately. She currently does not take any medications. Offers me little information that she used to be on Lasix in the past, some antibiotics, and some creams. Denies any heart history, denies any pulmonary disease, denies any thyroid disease. Denies any history of CVAs.   Review of Systems: As per history of present illness, otherwise negative  Past Medical History  Diagnosis Date  . Renal disorder   . Blood transfusion   . Osteomyelitis   . Arthritis    Past Surgical History  Procedure Laterality Date  . Bladder surgery    . Total hip arthroplasty    . Tubal ligation    . Eye surgery    . Tonsillectomy     Social History:  reports that she has never smoked. She does not have any smokeless tobacco history on file. She reports that she does not drink alcohol or use illicit drugs.  Allergies  Allergen Reactions  . Other Shortness Of Breath and Palpitations   Patient is allergic to all antibiotics  She states she has to be tested before receiving antibiotics   . Amoxicillin   . Iohexol      Desc: POSS IV CONTRAST ALLERGY NOTED ON CT A/P FROM 2005. UNABLE TO DEFINITELY CONFIRM W/ PT, PT'S FRIEND OR OFFICE. DONE W/O IV PER RAD ON 08/16/10, JB, Onset Date: [id number]   . Penicillins    History reviewed. No pertinent family history.   Prior to Admission medications   Medication Sig Start Date End Date Taking? Authorizing Provider  OVER THE COUNTER MEDICATION Take by mouth daily. Shakley Vitamins: calcium, D3, meal shake, vitalizer (multivitamins), fish oil    Yes Historical Provider, MD   Physical Exam: Filed Vitals:   09/28/13 1300 09/28/13 1353 09/28/13 1400 09/28/13 1539  BP: 114/45 124/53 120/42 127/49  Pulse: 69 87  78  Temp:    98.2 F (36.8 C)  TempSrc:    Oral  Resp: 16 19 14 16   Height:    4\' 11"  (1.499 m)  SpO2: 98% 96%  98%     General:  No apparent distress  Eyes: PERRL, EOMI, no scleral icterus  ENT: moist oropharynx  Neck: supple, no JVD  Cardiovascular: regular rate without MRG; 2+ peripheral pulses  Respiratory: CTA biL, good air movement without wheezing, rhonchi or crackled  Abdomen: soft, non tender to palpation, positive bowel sounds, no guarding, no rebound  Skin: no rashes  Musculoskeletal: 3+ pitting peripheral edema  Psychiatric: Laughing inappropriately at times  Neurologic: CN  2-12 grossly intact, MS 5/5 in all 4  Labs on Admission:  Basic Metabolic Panel:  Recent Labs Lab 09/28/13 0800  NA 140  K 3.4*  CL 100  CO2 29  GLUCOSE 111*  BUN 19  CREATININE 0.56  CALCIUM 9.6   Liver Function Tests:  Recent Labs Lab 09/28/13 0800  AST 22  ALT 17  ALKPHOS 115  BILITOT 0.4  PROT 6.4  ALBUMIN 3.4*   CBC:  Recent Labs Lab 09/28/13 0800  WBC 7.6  NEUTROABS 5.7  HGB 12.9  HCT 37.7  MCV 90.2  PLT 277   Cardiac Enzymes:  Recent Labs Lab 09/28/13 0800  CKTOTAL 162  TROPONINI  <0.30   Radiological Exams on Admission: Dg Hip Complete Left  09/28/2013   CLINICAL DATA:  Left hip pain, fall  EXAM: LEFT HIP - COMPLETE 2+ VIEW  COMPARISON:  02/13/2009  FINDINGS: Three views of the left hip submitted. There is diffuse osteopenia. Again noted right hip prosthesis. No definite acute fracture is identified. There is significant progression of degenerative changes. Significant narrowing of superior left hip joint space. Cystic and sclerotic changes are noted superior acetabulum. Sclerotic changes are noted superior aspect of femoral head. There is collapse and remodeling with partial impaction of superior aspect of femoral head of indeterminate age. Clinical correlation is necessary, further correlation with MRI could be performed as clinically warranted.  IMPRESSION: There is diffuse osteopenia. Again noted right hip prosthesis. No definite acute fracture is identified. There is significant progression of degenerative changes. Significant narrowing of superior left hip joint space. Cystic and sclerotic changes are noted superior acetabulum. Sclerotic changes are noted superior aspect of femoral head. There is collapse and remodeling with partial impaction of superior aspect of femoral head of indeterminate age. Clinical correlation is necessary, further correlation with MRI could be performed as clinically warranted.   Electronically Signed   By: Natasha Mead M.D.   On: 09/28/2013 09:07   Ct Head Wo Contrast  09/28/2013   CLINICAL DATA:  Larey Seat 11 p.m. yesterday, head pain  EXAM: CT HEAD WITHOUT CONTRAST  TECHNIQUE: Contiguous axial images were obtained from the base of the skull through the vertex without intravenous contrast.  COMPARISON:  CT HEAD W/O CM dated 08/16/2010; DG CERVICAL SPINE 2-3 VIEWS dated 02/13/2009  FINDINGS: Stable mild atrophy. No evidence of hemorrhage, infarct, or mass. No mass effect or hydrocephalus. Calvarium is intact.  IMPRESSION: No significant change from prior  study.  Age-related atrophy.   Electronically Signed   By: Esperanza Heir M.D.   On: 09/28/2013 08:18   Dg Humerus Left  09/28/2013   CLINICAL DATA:  Pain post fall  EXAM: LEFT HUMERUS - 2+ VIEW  COMPARISON:  None.  FINDINGS: Advanced degenerative changes at the glenohumeral articulation with remodeling of the humeral head. Mild diffuse osteopenia. Negative for fracture, dislocation, or other acute bone abnormality.  IMPRESSION: 1. Negative for fracture or other acute bone abnormality. 2. Glenohumeral degenerative changes as above.   Electronically Signed   By: Oley Balm M.D.   On: 09/28/2013 11:12    EKG: Independently reviewed.  Assessment/Plan Active Problems:   FOOT PAIN   FATIGUE   UTI (lower urinary tract infection)   Bilateral leg edema  UTI - start empiric ciprofloxacin given allergies. Obtain urine culture. Pitting lower extremity edema - patient offers little information about this other than that she was on Lasix at one point in the past. Hold Lasix tonight is normotensive and treating  urinary tract infection, considered in the morning. Given falls, obtain 2-D echo. Deconditioning - obtain PT consult Degenerative hip changes - noted on the hip x-ray, with collapse and remodeling with partial impaction of superior aspect of femoral head of indeterminate age. Patient currently laughs when this is being brought into discussion and is ambiguous about further pursuing, but probably this needs to be readdressed at one point and consider discussing with ortho.   Diet: Regular Fluids: None DVT Prophylaxis: Lovenox  Code Status: Full code (presumed)  Family Communication: Friends (from [employer]) at bedside  Disposition Plan: Inpatient  Time spent: 5  This note has been created with Education officer, environmental. Any transcriptional errors are unintentional.   Kailene Steinhart M. Elvera Lennox, MD [facility] Pager [phone]  If 7PM-7AM, please  contact night-coverage [email or web address] Password [facility] 09/28/2013, 6:27 PM

## 2013-09-29 DIAGNOSIS — R531 Weakness: Secondary | ICD-10-CM | POA: Diagnosis present

## 2013-09-29 DIAGNOSIS — R5381 Other malaise: Secondary | ICD-10-CM

## 2013-09-29 DIAGNOSIS — M79609 Pain in unspecified limb: Secondary | ICD-10-CM

## 2013-09-29 DIAGNOSIS — E876 Hypokalemia: Secondary | ICD-10-CM

## 2013-09-29 DIAGNOSIS — W19XXXA Unspecified fall, initial encounter: Secondary | ICD-10-CM

## 2013-09-29 DIAGNOSIS — R609 Edema, unspecified: Secondary | ICD-10-CM

## 2013-09-29 DIAGNOSIS — I369 Nonrheumatic tricuspid valve disorder, unspecified: Secondary | ICD-10-CM

## 2013-09-29 DIAGNOSIS — R5383 Other fatigue: Secondary | ICD-10-CM

## 2013-09-29 LAB — CBC
HCT: 34.8 % — ABNORMAL LOW (ref 36.0–46.0)
HEMOGLOBIN: 11.8 g/dL — AB (ref 12.0–15.0)
MCH: 31.1 pg (ref 26.0–34.0)
MCHC: 33.9 g/dL (ref 30.0–36.0)
MCV: 91.6 fL (ref 78.0–100.0)
Platelets: 251 10*3/uL (ref 150–400)
RBC: 3.8 MIL/uL — AB (ref 3.87–5.11)
RDW: 13.7 % (ref 11.5–15.5)
WBC: 5.9 10*3/uL (ref 4.0–10.5)

## 2013-09-29 LAB — BASIC METABOLIC PANEL
BUN: 12 mg/dL (ref 6–23)
CALCIUM: 8.6 mg/dL (ref 8.4–10.5)
CHLORIDE: 102 meq/L (ref 96–112)
CO2: 28 meq/L (ref 19–32)
Creatinine, Ser: 0.5 mg/dL (ref 0.50–1.10)
GFR calc Af Amer: >90 mL/min (ref >90)
GFR calc non Af Amer: >90 mL/min (ref >90)
Glucose, Bld: 126 mg/dL — ABNORMAL HIGH (ref 70–99)
POTASSIUM: 3.5 meq/L — AB (ref 3.7–5.3)
SODIUM: 141 meq/L (ref 137–147)

## 2013-09-29 LAB — VITAMIN B12: VITAMIN B 12: 1223 pg/mL — AB (ref 211–911)

## 2013-09-29 LAB — PRO B NATRIURETIC PEPTIDE: Pro B Natriuretic peptide (BNP): 308 pg/mL (ref 0–450)

## 2013-09-29 LAB — TSH: TSH: 1.663 u[IU]/mL (ref 0.350–4.500)

## 2013-09-29 MED ORDER — POTASSIUM CHLORIDE CRYS ER 20 MEQ PO TBCR
20.0000 meq | EXTENDED_RELEASE_TABLET | Freq: Two times a day (BID) | ORAL | Status: DC
  Administered 2013-09-29 – 2013-10-04 (×10): 20 meq via ORAL
  Filled 2013-09-29 (×12): qty 1

## 2013-09-29 NOTE — Progress Notes (Signed)
  Echocardiogram 2D Echocardiogram has been performed.  Arvil ChacoFoster, Azeem Poorman 09/29/2013, 12:06 PM

## 2013-09-29 NOTE — Progress Notes (Deleted)
   CARE MANAGEMENT NOTE 09/29/2013  Patient:  Kendra Snyder,Kendra Snyder   Account Number:  [id number]  Date Initiated:  09/29/2013  Documentation initiated by:  Ashantia Amaral  Subjective/Objective Assessment:   glioblastoma brain cancer     Action/Plan:   lives with husband   Anticipated DC Date:  09/29/2013   Anticipated DC Plan:  HOME W HOSPICE CARE      DC Planning Services  CM consult      Choice offered to / List presented to:     DME arranged  HOSPITAL BED      DME agency  [facility]        HH agency  [facility]    Status of service:  Completed, signed off Medicare Important Message given?   (If response is "NO", the following Medicare IM given date fields will be blank) Date Medicare IM given:   Date Additional Medicare IM given:    Discharge Disposition:  HOME W HOSPICE CARE  Per UR Regulation:    If discussed at Long Length of Stay Meetings, dates discussed:    Comments:  09/29/2013 1300 NCM received call from [facility] on call RN they have accepted referral. Spoke to pt and gave permission to speak to husband. Requesting hospital bed for home. Notified [facility] DME rep for hospital bed for home. Faxed orders, dc summary and facesheet to [facility].  Amijah Timothy RN CCM Case Mgmt phone [phone]  

## 2013-09-29 NOTE — Progress Notes (Signed)
   CARE MANAGEMENT NOTE 09/29/2013  Patient:  Barnett ApplebaumMALANOWSKI,Vasilisa M   Account Number:  [id number]  Date Initiated:  09/28/2013  Documentation initiated by:  [facility]Kingjames Coury  Subjective/Objective Assessment:   mechanical fall     Action/Plan:   lives at home alone   Anticipated DC Date:  10/01/2013   Anticipated DC Plan:  SKILLED NURSING FACILITY      DC Planning Services  CM consult      Choice offered to / List presented to:             Status of service:  Completed, signed off Medicare Important Message given?   (If response is "NO", the following Medicare IM given date fields will be blank) Date Medicare IM given:   Date Additional Medicare IM given:    Discharge Disposition:  SKILLED NURSING FACILITY  Per UR Regulation:    If discussed at Long Length of Stay Meetings, dates discussed:    Comments:  09/29/2013 1340 NCM attempted call to dtr, Jari FavreSherry Sexton. Left message for a return call. PT/OT recommending SNF. Referral to CSW for possible SNF placement.  Isidoro DonningAlesia Alick Lecomte RN CCM Case Mgmt phone [phone]  09/28/2013 1400 NCM spoke to [place] Rodriguez. Explained will wait on Rollator at this time until final recommendations for home. Isidoro DonningAlesia Houda Brau RN CCM Case Mgmt phone [phone]  09/28/2013 1130 NCM spoke to pt and states she lives at home alone. She has a group of people that come into the home but she is not sure what program or agency. She gets Meals On Wheels, M-F. Her dtr, Jari FavreSherry Sexton # [phone] [place]. Gave permission to call dtr, or friends. Left message for dtr. Contacted Solon PalmLinda Parrish [phone]. States she will see her on tomorrow at her home. She did not have any information on caregivers. She will call dtr to make aware of pt's situation. Pt wanted NCM to call Elvina MattesJesus Rodriguez, # [phone]. She states her brakes on her Rollator is not working. She is requesting another Rollator. States organization working with her at home are trying to get her a  motorized wheelchair. Explained Rollator out of pocket is $145. Contacted Jesus and he can bring money for Rollator to use at home. She also has a neighbor, Sid that assist with helping her get her groceries and meds.  Notified MD of updates and PT ordered to evaluate pt for services needed at home. Isidoro DonningAlesia Kamareon Sciandra RN CCM Case Mgmt phone [phone]

## 2013-09-29 NOTE — Progress Notes (Signed)
09/29/2013 1445 NCM spoke to dtr,  Jari FavreSherry Sexton # [phone] and agrees with SNF. She will call to speak to her mother/pt at bedside. She would like to have pt closer to her [place] in [place]. Requesting call from MD, message sent to attending. Isidoro DonningAlesia Brentley Landfair RN CCM Case Mgmt phone [phone]

## 2013-09-29 NOTE — Progress Notes (Signed)
PROGRESS NOTE   [facility]ouchJulia M [facility] ZOX:[id number] DOB: 11/23/1934 DOA: 09/28/2013 PCP: Sonda PrimesAlex Plotnikov, MD  Brief narrative: Kendra Snyder is an 79 y.o. female with PMH of remote right hip replacement complicated by osteomyelitis who was admitted 09/28/13 status post fall. Upon initial evaluation in the ED, she was found to have tangential speech and inappropriate laughter. Left hip, left humerus films were without evidence of acute fracture. CT scan of the head was unremarkable.  Assessment/Plan: Principal Problem:   Fall with generalized weakness and fatigue Initial radiographs were negative for fracture. PT/OT evaluations requested. CK levels not elevated. Check TSH and B12 levels. Active Problems:   FOOT PAIN Toradol as needed.   UTI (lower urinary tract infection) Urinalysis had white blood cells and bacteria. On empiric Cipro. Followup cultures.   Bilateral leg edema 2-D echocardiogram requested. ProBNP not elevated.   Hypokalemia Place on oral supplementation.   DVT Prophylaxis Continue Lovenox.  Code Status: Full. Family Communication: Jari FavreSherry Sexton [phone] Disposition Plan: Likely SNF.   IV access:  Peripheral IV.  Medical Consultants:  None.  Other Consultants:  Physical therapy  Occupational therapy  Anti-infectives:  Cipro 09/28/13--->  HPI/Subjective: Kendra PouchJulia M [facility] continues to complain of foot pain. She has had progressive leg swelling. No shortness of breath or chest pain. No nausea or vomiting. Several close friends are in the room and tell me that she has had multiple falls at home. She apparently has 6 children and only is in contact with one daughter who I am unable to reach by telephone.  Objective: Filed Vitals:   09/28/13 1400 09/28/13 1539 09/28/13 2120 09/29/13 0533  BP: 120/42 127/49 121/49 131/58  Pulse:  78 85 78  Temp:  98.2 F (36.8 C) 98.4 F (36.9 C) 97.5 F (36.4 C)  TempSrc:  Oral Oral Oral  Resp: 14 16  14     Height:  4\' 11"  (1.499 m)    Weight:  56.019 kg (123 lb 8 oz)    SpO2:  98% 98% 97%    Intake/Output Summary (Last 24 hours) at 09/29/13 0753 Last data filed at 09/29/13 0533  Gross per 24 hour  Intake    240 ml  Output      0 ml  Net    240 ml    Exam: Gen:  NAD, inappropriate affect Cardiovascular:  RRR, No M/R/G Respiratory:  Lungs CTAB Gastrointestinal:  Abdomen soft, NT/ND, + BS Extremities:  1+ edema  Data Reviewed: Basic Metabolic Panel:  Recent Labs Lab 09/28/13 0800 09/29/13 0429  NA 140 141  K 3.4* 3.5*  CL 100 102  CO2 29 28  GLUCOSE 111* 126*  BUN 19 12  CREATININE 0.56 0.50  CALCIUM 9.6 8.6   GFR Estimated Creatinine Clearance: 45.6 ml/min (by C-G formula based on Cr of 0.5). Liver Function Tests:  Recent Labs Lab 09/28/13 0800  AST 22  ALT 17  ALKPHOS 115  BILITOT 0.4  PROT 6.4  ALBUMIN 3.4*   CBC:  Recent Labs Lab 09/28/13 0800 09/29/13 0429  WBC 7.6 5.9  NEUTROABS 5.7  --   HGB 12.9 11.8*  HCT 37.7 34.8*  MCV 90.2 91.6  PLT 277 251   Cardiac Enzymes:  Recent Labs Lab 09/28/13 0800  CKTOTAL 162  TROPONINI <0.30   BNP (last 3 results)  Recent Labs  09/29/13 0424  PROBNP 308.0   Thyroid function studies No results found for this basename: TSH, T4TOTAL, FREET3, T3FREE, THYROIDAB,  in the  last 72 hours Anemia work up No results found for this basename: VITAMINB12, FOLATE, FERRITIN, TIBC, IRON, RETICCTPCT,  in the last 72 hours Microbiology No results found for this or any previous visit (from the past 240 hour(s)).   Procedures and Diagnostic Studies: Dg Hip Complete Left  09/28/2013   CLINICAL DATA:  Left hip pain, fall  EXAM: LEFT HIP - COMPLETE 2+ VIEW  COMPARISON:  02/13/2009  FINDINGS: Three views of the left hip submitted. There is diffuse osteopenia. Again noted right hip prosthesis. No definite acute fracture is identified. There is significant progression of degenerative changes. Significant narrowing of  superior left hip joint space. Cystic and sclerotic changes are noted superior acetabulum. Sclerotic changes are noted superior aspect of femoral head. There is collapse and remodeling with partial impaction of superior aspect of femoral head of indeterminate age. Clinical correlation is necessary, further correlation with MRI could be performed as clinically warranted.  IMPRESSION: There is diffuse osteopenia. Again noted right hip prosthesis. No definite acute fracture is identified. There is significant progression of degenerative changes. Significant narrowing of superior left hip joint space. Cystic and sclerotic changes are noted superior acetabulum. Sclerotic changes are noted superior aspect of femoral head. There is collapse and remodeling with partial impaction of superior aspect of femoral head of indeterminate age. Clinical correlation is necessary, further correlation with MRI could be performed as clinically warranted.   Electronically Signed   By: Natasha Mead M.D.   On: 09/28/2013 09:07   Ct Head Wo Contrast  09/28/2013   CLINICAL DATA:  Larey Seat 11 p.m. yesterday, head pain  EXAM: CT HEAD WITHOUT CONTRAST  TECHNIQUE: Contiguous axial images were obtained from the base of the skull through the vertex without intravenous contrast.  COMPARISON:  CT HEAD W/O CM dated 08/16/2010; DG CERVICAL SPINE 2-3 VIEWS dated 02/13/2009  FINDINGS: Stable mild atrophy. No evidence of hemorrhage, infarct, or mass. No mass effect or hydrocephalus. Calvarium is intact.  IMPRESSION: No significant change from prior study.  Age-related atrophy.   Electronically Signed   By: Esperanza Heir M.D.   On: 09/28/2013 08:18   Dg Humerus Left  09/28/2013   CLINICAL DATA:  Pain post fall  EXAM: LEFT HUMERUS - 2+ VIEW  COMPARISON:  None.  FINDINGS: Advanced degenerative changes at the glenohumeral articulation with remodeling of the humeral head. Mild diffuse osteopenia. Negative for fracture, dislocation, or other acute bone  abnormality.  IMPRESSION: 1. Negative for fracture or other acute bone abnormality. 2. Glenohumeral degenerative changes as above.   Electronically Signed   By: Oley Balm M.D.   On: 09/28/2013 11:12    Scheduled Meds: . ciprofloxacin  400 mg Intravenous Q12H  . enoxaparin (LOVENOX) injection  40 mg Subcutaneous QHS   Continuous Infusions:   Time spent: 35 minutes with > 50% of time discussing current diagnostic test results, clinical impression and plan of care with the patient and her caregivers.    LOS: 1 day   Flannery Cavallero  [facility] Pager [phone]. If unable to reach me by pager, please call my cell phone at [phone].  *Please note that the hospitalists switch teams on Wednesdays. Please call the flow manager at [phone] if you are having difficulty reaching the hospitalist taking care of this patient as she can update you and provide the most up-to-date pager number of provider caring for the patient. If 8PM-8AM, please contact night-coverage at [email or web address], password [facility]  09/29/2013, 7:53 AM    **Disclaimer: This note was  dictated with voice recognition software. Similar sounding words can inadvertently be transcribed and this note may contain transcription errors which may not have been corrected upon publication of note.**

## 2013-09-29 NOTE — Evaluation (Signed)
Occupational Therapy Evaluation Patient Details Name: Kendra Snyder MRN: [id number] DOB: 02/06/1935 Today's Date: 09/29/2013 Time: [id number] OT Time Calculation (min): 30 min  OT Assessment / Plan / Recommendation History of present illness 79 yo female s/p x2 falls at home, frequent incontinence. Pt with prolonged period of time on the floor prior to calling EMS for help. PT with limited support system to (A) at home level    Clinical Impression   Pt required extensive effort with pain in the left side of the body. Pt c/o pain at hip with flexion, the knee with tactile palpation at patella medial side and top of left foot. Pt with edema at the left knee. Pt noted to have limited knee extension due to edema (question contracture). Pt reporting new Lt shoulder pain s/p falls. Pt with a lot of questions about treatment for bone on bone left hip. Pt encouraged to discuss with MD. Pt educated on use of female urinal and bladder management protocol. RN and tech informed of attempting to void bladder every 2 hours to decr risk for skin break down due to incontinence supine. Pt is agreeable to use urinal at this time. In the PM hours if patient is fatigued, please use same 2 hours attempt with bed pan.    OT Assessment  Patient needs continued OT Services    Follow Up Recommendations  SNF;Supervision/Assistance - 24 hour    Barriers to Discharge Decreased caregiver support no assistance at home  Equipment Recommendations  3 in 1 bedside comode;Wheelchair (measurements OT);Wheelchair cushion (measurements OT);Hospital bed (DEFER TO SNF)    Recommendations for Other Services    Frequency  Min 2X/week    Precautions / Restrictions Precautions Precautions: Fall Precaution Comments: incontinence- high risk for skin break down Restrictions Weight Bearing Restrictions: No    Pertinent Vitals/Pain Severe pain LT shoulder knee and foot   ADL  Grooming: Wash/dry face;Brushing hair;Set up Where  Assessed - Grooming: Supported sitting;Supine, head of bed up Upper Body Bathing: Chest;Right arm;Left arm;Abdomen;Moderate assistance Where Assessed - Upper Body Bathing: Supported sitting Toilet Transfer: Minimal assistance Toilet Transfer Method: Sit to [place] Toilet Transfer Equipment: Bedside commode Toileting - Clothing Manipulation and Hygiene: Moderate assistance Where Assessed - Toileting Clothing Manipulation and Hygiene: Sit to stand from 3-in-1 or toilet Equipment Used: Rolling walker Transfers/Ambulation Related to ADLs: Pt completed bed mobility.Pt educatd on hand placement with sit<>stand . pt static standing and positioning female urinal . Pt unable to void at this time. Pt able to return to sitting and with extended time to supine. ADL Comments: Pt required extended time and great effort with all mobility. pt needed > 3 minutes to progress from supine to sitting. Pt needed to pull on sheets and take a rest break due to effort required. Pt educated on the importance of  calling staff for incontinence. Pt educated on completing transfer every 2 hours with staff to decr incontinence. RN and tech notified to complete transfer with staff. pt is agreeable and very tearful about current decr mobility. Pt tells story about brother that was quadriplegic and is ability to progress with therapy. Pt eager to regain independence.     OT Diagnosis: Generalized weakness;Cognitive deficits;Acute pain  OT Problem List: Decreased strength;Decreased range of motion;Decreased activity tolerance;Impaired balance (sitting and/or standing);Decreased safety awareness;Decreased cognition;Decreased knowledge of use of DME or AE;Decreased knowledge of precautions;Pain OT Treatment Interventions: Self-care/ADL training;Therapeutic exercise;DME and/or AE instruction;Therapeutic activities;Cognitive remediation/compensation;Patient/family education;Balance training   OT Goals(Current goals can be found in  the care  plan section) Acute Rehab OT Goals Patient Stated Goal: willing to get rehab with intention to return home alone OT Goal Formulation: With patient Time For Goal Achievement: 10/13/13 Potential to Achieve Goals: Good  Visit Information  Last OT Received On: 09/29/13 Assistance Needed: +1 (+2 for attempts at ambulation) History of Present Illness: 79 yo female s/p x2 falls at home, frequent incontinence. Pt with prolonged period of time on the floor prior to calling EMS for help. PT with limited support system to (A) at home level        Prior Functioning     Home Living Family/patient expects to be discharged to:: Skilled nursing facility Living Arrangements: Alone Type of Home: Apartment Home Access: Level entry Home Equipment: Walker - 4 wheels (used at a wheelchair. s) Additional Comments: Pt prior to hospitalization drove a car and did all household management. Pt would drive her car to grocery store. She would pull rollator from the back seat and walk into the store. Pt transfered onto electric scooter to shop. Pt would drive to doctor apointments use rollator to ambulate into the building and have staff w/c her to desk. Pt only used rollator as w/c inside the home. pt reports severe pain on left LE at baseline. Prior Function Level of Independence: Needs assistance Gait / Transfers Assistance Needed: PTA pt ambulated with rollator in community setting . pt only used rollator as w/c in the home setting due to pain.  ADL's / Homemaking Assistance Needed: pt self reports difficulty and poor management. Pt states "ive been living worse than a rat" Pt very tearful and expressed concern with home life. Pt wearing diapers but to save money does not wear pants at home. instead pt stands and attempts to void into bottles. Pt was not using a urinal so catching urinal was not always successful. Pt incr risk of fall due to wet floor with incontinence  Comments: Pt reports wanting to return to  home life independently but recognizes she does not have quality of life at this time at current level. Pt agreeable to go to SNF. Pt does want to return to apartment after rehab.  Communication Communication: No difficulties Dominant Hand: Right         Vision/Perception Vision - History Baseline Vision: Wears glasses all the time Patient Visual Report: No change from baseline Vision - Assessment Vision Assessment: Vision not tested Perception Perception: Within Functional Limits   Cognition  Cognition Arousal/Alertness: Awake/alert Behavior During Therapy: WFL for tasks assessed/performed Overall Cognitive Status: Impaired/Different from baseline Area of Impairment: Safety/judgement;Awareness;Problem solving Safety/Judgement: Decreased awareness of deficits Awareness: Anticipatory Problem Solving: Slow processing General Comments: Pt is oriented x4 and recognizes that home management has become increasingly harder. Pt does not recognize the danager for poor hygiene and falling repeatly at this time.     Extremity/Trunk Assessment Upper Extremity Assessment Upper Extremity Assessment: LUE deficits/detail LUE Deficits / Details: severe pain, flexion limitation, pain with abduction. not formally assessed. PT verbalized and functional use LUE: Unable to fully assess due to pain Lower Extremity Assessment Lower Extremity Assessment: LLE deficits/detail LLE Deficits / Details: Pt noted to have ROM restrictions with extension. pt unable to fully extend knee. pt with pain with tactile input at the patella area medial side. pt with edema noted. Pt reports pain at dorsal side of foot LLE: Unable to fully assess due to pain Cervical / Trunk Assessment Cervical / Trunk Assessment: Kyphotic     Mobility Bed Mobility  Overal bed mobility: Needs Assistance Bed Mobility: Supine to Sit;Sit to Supine Supine to sit: Min assist;HOB elevated (HOB 20 degrees simulate pillows) Sit to supine:  Min assist General bed mobility comments: pt required > 5 minutes for sit<>Supine. Pt pull on sheets, bed rails, hooking RT LE off eob of bed. Pt is high risk for becoming stuck supine in bed due to effort to progress to EOB.  Transfers Overall transfer level: Needs assistance Equipment used: Rolling walker (2 wheeled) Transfers: Sit to/from Stand Sit to Stand: Min assist General transfer comment: pt educated on hand placement. Pt static standing min (A). Pt with LT LE knee flexed with static standing. pt with weight shift to the Rt LE. pt with TTWB on LT LE due to pain     Exercise     Balance Balance Overall balance assessment: Needs assistance Sitting balance-Leahy Scale: Poor   End of Session OT - End of Session Activity Tolerance: Patient limited by pain Patient left: in bed;with call bell/phone within reach Nurse Communication: Mobility status;Precautions  GO     Harolyn Rutherford 09/29/2013, 11:58 AM Pager: [phone]

## 2013-09-30 ENCOUNTER — Inpatient Hospital Stay (HOSPITAL_COMMUNITY): Payer: Medicare HMO

## 2013-09-30 DIAGNOSIS — M25559 Pain in unspecified hip: Secondary | ICD-10-CM

## 2013-09-30 DIAGNOSIS — M949 Disorder of cartilage, unspecified: Secondary | ICD-10-CM

## 2013-09-30 DIAGNOSIS — M25552 Pain in left hip: Secondary | ICD-10-CM | POA: Diagnosis present

## 2013-09-30 DIAGNOSIS — M8580 Other specified disorders of bone density and structure, unspecified site: Secondary | ICD-10-CM | POA: Diagnosis present

## 2013-09-30 DIAGNOSIS — M25569 Pain in unspecified knee: Secondary | ICD-10-CM

## 2013-09-30 DIAGNOSIS — M1612 Unilateral primary osteoarthritis, left hip: Secondary | ICD-10-CM | POA: Diagnosis present

## 2013-09-30 DIAGNOSIS — M25562 Pain in left knee: Secondary | ICD-10-CM | POA: Diagnosis present

## 2013-09-30 DIAGNOSIS — M899 Disorder of bone, unspecified: Secondary | ICD-10-CM

## 2013-09-30 LAB — BASIC METABOLIC PANEL
BUN: 13 mg/dL (ref 6–23)
CHLORIDE: 109 meq/L (ref 96–112)
CO2: 27 mEq/L (ref 19–32)
Calcium: 9 mg/dL (ref 8.4–10.5)
Creatinine, Ser: 0.51 mg/dL (ref 0.50–1.10)
GFR calc non Af Amer: >90 mL/min (ref >90)
Glucose, Bld: 103 mg/dL — ABNORMAL HIGH (ref 70–99)
Potassium: 4.1 mEq/L (ref 3.7–5.3)
Sodium: 147 mEq/L (ref 137–147)

## 2013-09-30 LAB — OCCULT BLOOD X 1 CARD TO LAB, STOOL: FECAL OCCULT BLD: NEGATIVE

## 2013-09-30 MED ORDER — FUROSEMIDE 20 MG PO TABS
20.0000 mg | ORAL_TABLET | Freq: Every day | ORAL | Status: DC
  Administered 2013-09-30 – 2013-10-01 (×2): 20 mg via ORAL
  Filled 2013-09-30 (×3): qty 1

## 2013-09-30 NOTE — Progress Notes (Addendum)
PROGRESS NOTE   Kendra Snyder [facility] ZOX:[id number] DOB: March 23, 1935 DOA: 09/28/2013 PCP: [name], MD  Brief narrative: Kendra Snyder is an 79 y.o. female with PMH of remote right hip replacement complicated by osteomyelitis who was admitted 09/28/13 status post fall. Upon initial evaluation in the ED, she was found to have tangential speech and inappropriate laughter. Left hip, left humerus films were without evidence of acute fracture. CT scan of the head was unremarkable.  Assessment/Plan: Principal Problem:   Fall with left hip and knee pain (in the setting of osteopenia and osteoarthritis), generalized weakness and fatigue Initial radiographs were negative for fracture.  CT scan of the left hip negative for subtle fracture. PT/OT evaluations performed 09/28/13 with recommendations for SNF placement. CK levels not elevated. TSH WNL and B12 levels high.  Spoke with Dr. Charlann Boxer about the patient's CT findings, and he feels she will need a THR.  The patient is unsure she wants to proceed, but surgery is tentatively scheduled for 10/03/13. Active Problems:   Black stools Heme check stools, D/C lovenox, check CBC in a.m.   FOOT PAIN Continue Toradol as needed.   UTI (lower urinary tract infection) Urinalysis had white blood cells and bacteria. On empiric Cipro. Followup cultures.   Bilateral leg edema 2-D echocardiogram done 09/29/13, EF 65-70%, no diastolic dysfunction. ProBNP not elevated. Patient has only mild hypoalbuminemia. Blood pressure not markedly elevated. We'll start on Lasix.   Hypokalemia Continue oral supplementation.   DVT Prophylaxis Continue Lovenox.  Code Status: Full. Family Communication: Jari Favre [phone], updated by telephone. Disposition Plan: Likely SNF.   IV access:  Peripheral IV.  Medical Consultants:  Dr. Charlann Boxer, Orthopedics.  Other Consultants:  Physical therapy  Occupational therapy: 24 hour care versus SNF  recommended.  Anti-infectives:  Cipro 09/28/13--->  HPI/Subjective: Kendra Snyder [facility] reports some improvement in left hip pain and was able to ambulate with physical therapy today. No nausea or vomiting but she does report new black-colored stools. No abdominal pain.  Objective: Filed Vitals:   09/29/13 0533 09/29/13 1430 09/29/13 2101 09/30/13 0440  BP: 131/58 137/44 141/40 143/52  Pulse: 78 75 81 73  Temp: 97.5 F (36.4 C) 98.5 F (36.9 C) 98 F (36.7 C) 98.1 F (36.7 C)  TempSrc: Oral Oral Oral Oral  Resp: 14 18 16 18   Height:      Weight:      SpO2: 97% 100% 100% 100%    Intake/Output Summary (Last 24 hours) at 09/30/13 0740 Last data filed at 09/30/13 0610  Gross per 24 hour  Intake   1400 ml  Output   1275 ml  Net    125 ml    Exam: Gen:  NAD Cardiovascular:  RRR, No M/R/G Respiratory:  Lungs CTAB Gastrointestinal:  Abdomen soft, NT/ND, + BS Extremities:  1+ edema  Data Reviewed: Basic Metabolic Panel:  Recent Labs Lab 09/28/13 0800 09/29/13 0429 09/30/13 0350  NA 140 141 147  K 3.4* 3.5* 4.1  CL 100 102 109  CO2 29 28 27   GLUCOSE 111* 126* 103*  BUN 19 12 13   CREATININE 0.56 0.50 0.51  CALCIUM 9.6 8.6 9.0   GFR Estimated Creatinine Clearance: 45.6 ml/min (by C-G formula based on Cr of 0.51). Liver Function Tests:  Recent Labs Lab 09/28/13 0800  AST 22  ALT 17  ALKPHOS 115  BILITOT 0.4  PROT 6.4  ALBUMIN 3.4*   CBC:  Recent Labs Lab 09/28/13 0800 09/29/13 0429  WBC  7.6 5.9  NEUTROABS 5.7  --   HGB 12.9 11.8*  HCT 37.7 34.8*  MCV 90.2 91.6  PLT 277 251   Cardiac Enzymes:  Recent Labs Lab 09/28/13 0800  CKTOTAL 162  TROPONINI <0.30   BNP (last 3 results)  Recent Labs  09/29/13 0424  PROBNP 308.0   Thyroid function studies  Recent Labs  09/29/13 0848  TSH 1.663   Anemia work up  Recent Labs  09/29/13 0848  [id number] 1223*   Microbiology No results found for this or any previous visit (from the past  240 hour(s)).   Procedures and Diagnostic Studies: Dg Hip Complete Left  09/28/2013   CLINICAL DATA:  Left hip pain, fall  EXAM: LEFT HIP - COMPLETE 2+ VIEW  COMPARISON:  02/13/2009  FINDINGS: Three views of the left hip submitted. There is diffuse osteopenia. Again noted right hip prosthesis. No definite acute fracture is identified. There is significant progression of degenerative changes. Significant narrowing of superior left hip joint space. Cystic and sclerotic changes are noted superior acetabulum. Sclerotic changes are noted superior aspect of femoral head. There is collapse and remodeling with partial impaction of superior aspect of femoral head of indeterminate age. Clinical correlation is necessary, further correlation with MRI could be performed as clinically warranted.  IMPRESSION: There is diffuse osteopenia. Again noted right hip prosthesis. No definite acute fracture is identified. There is significant progression of degenerative changes. Significant narrowing of superior left hip joint space. Cystic and sclerotic changes are noted superior acetabulum. Sclerotic changes are noted superior aspect of femoral head. There is collapse and remodeling with partial impaction of superior aspect of femoral head of indeterminate age. Clinical correlation is necessary, further correlation with MRI could be performed as clinically warranted.   Electronically Signed   By: Natasha MeadLiviu  Pop M.D.   On: 09/28/2013 09:07   Ct Head Wo Contrast  09/28/2013   CLINICAL DATA:  Larey SeatFell 11 p.m. yesterday, head pain  EXAM: CT HEAD WITHOUT CONTRAST  TECHNIQUE: Contiguous axial images were obtained from the base of the skull through the vertex without intravenous contrast.  COMPARISON:  CT HEAD W/O CM dated 08/16/2010; DG CERVICAL SPINE 2-3 VIEWS dated 02/13/2009  FINDINGS: Stable mild atrophy. No evidence of hemorrhage, infarct, or mass. No mass effect or hydrocephalus. Calvarium is intact.  IMPRESSION: No significant change from  prior study.  Age-related atrophy.   Electronically Signed   By: Esperanza Heiraymond  Rubner M.D.   On: 09/28/2013 08:18   Dg Humerus Left  09/28/2013   CLINICAL DATA:  Pain post fall  EXAM: LEFT HUMERUS - 2+ VIEW  COMPARISON:  None.  FINDINGS: Advanced degenerative changes at the glenohumeral articulation with remodeling of the humeral head. Mild diffuse osteopenia. Negative for fracture, dislocation, or other acute bone abnormality.  IMPRESSION: 1. Negative for fracture or other acute bone abnormality. 2. Glenohumeral degenerative changes as above.   Electronically Signed   By: Oley Balmaniel  Hassell M.D.   On: 09/28/2013 11:12    Scheduled Meds: . ciprofloxacin  400 mg Intravenous Q12H  . enoxaparin (LOVENOX) injection  40 mg Subcutaneous QHS  . potassium chloride  20 mEq Oral BID   Continuous Infusions:   Time spent: 25 minutes.    LOS: 2 days   Nazanin Kinner  [facility] Pager [phone]. If unable to reach me by pager, please call my cell phone at [phone].  *Please note that the hospitalists switch teams on Wednesdays. Please call the flow manager at [phone] if you are  having difficulty reaching the hospitalist taking care of this patient as she can update you and provide the most up-to-date pager number of provider caring for the patient. If 8PM-8AM, please contact night-coverage at [email or web address], password [email or web address]  09/30/2013, 7:40 AM    **Disclaimer: This note was dictated with voice recognition software. Similar sounding words can inadvertently be transcribed and this note may contain transcription errors which may not have been corrected upon publication of note.**

## 2013-09-30 NOTE — Progress Notes (Signed)
Clinical Social Work Department BRIEF PSYCHOSOCIAL ASSESSMENT 09/30/2013  Patient:  Kendra Snyder, Kendra Snyder     Account Number:  [id number]     Admit date:  09/28/2013  Clinical Social Worker:  Ulyess Blossom  Date/Time:  09/30/2013 10:30 AM  Referred by:  Physician  Date Referred:  09/30/2013 Referred for  SNF Placement   Other Referral:   Interview type:  Patient Other interview type:    PSYCHOSOCIAL DATA Living Status:  ALONE Admitted from facility:   Level of care:   Primary support name:  Kendra Snyder/dauhgter/[phone] Primary support relationship to patient:  CHILD, ADULT Degree of support available:   adequate    CURRENT CONCERNS Current Concerns  Post-Acute Placement   Other Concerns:    SOCIAL WORK ASSESSMENT / PLAN CSW received referral for New SNF placement.    CSW met with pt at bedside to discuss. PT currently in room about to provide PT treatment to pt. CSW introduced self and explained role.    CSW discussed recommendation for short term rehab at [facility]. Pt expressed that she was aware of recommendation and agreeable to plan. CSW discussed that notes indicated pt daughter wished to have pt closer to pt daughter whom lives in [place]. Pt discussed that she wanted to concentrate on facilities in [facility] and then if need to expand search then pt will. Pt reports that she has good support locally as well and preference would be to remain closer to home. Pt agreeable to [facility] and [facility] search. Pt agreeable to CSW contacted pt daughter via telephone to discuss.    CSW completed FL2 and initiated SNF search in [place] and [facility].    CSW contacted pt daughter, Kendra Hammans via telephone and left voice message.    CSW to follow up with pt and pt daughter regarding bed offers. CSW will expand SNF search to closer to pt daughter if pt agrees.    CSW to continue to follow.   Assessment/plan status:  Psychosocial Support/Ongoing  Assessment of Needs Other assessment/ plan:   discharge planning   Information/referral to community resources:   [facility] list    PATIENT'S/FAMILY'S RESPONSE TO PLAN OF CARE: Pt alert and oriented x 4. Pt acknowledged need for rehab at [facility] following hospitalization. Pt expressed that she wished to concentrate on facilities closer to home rather than close to pt daughter in [place].    Kendra Snyder, MSW, [place] [facility] [phone]

## 2013-09-30 NOTE — Progress Notes (Addendum)
Clinical Social Work Department CLINICAL SOCIAL WORK PLACEMENT NOTE 09/30/2013  Patient:  Barnett ApplebaumMALANOWSKI,Simar M  Account Number:  [id number] Admit date:  09/28/2013  Clinical Social Worker:  Jacelyn GripSUZANNA BYRD, LCSWA  Date/time:  09/30/2013 11:50 AM  Clinical Social Work is seeking post-discharge placement for this patient at the following level of care:   SKILLED NURSING   (*CSW will update this form in Epic as items are completed)   09/30/2013  [facility]/family provided with [facility] of Clinical Social Work's list of facilities offering this level of care within the geographic area requested by the patient (or if unable, by the patient's family).  09/30/2013  Patient/family informed of their freedom to choose among providers that offer the needed level of care, that participate in Medicare, Medicaid or managed care program needed by the patient, have an available bed and are willing to accept the patient.  09/30/2013  Patient/family informed of [facility]' ownership interest in [facility], as well as of the fact that they are under no obligation to receive care at this facility.  PASARR submitted to EDS on 09/30/2013 PASARR number received from EDS on 09/30/2013  FL2 transmitted to all facilities in geographic area requested by pt/family on  09/30/2013 FL2 transmitted to all facilities within larger geographic area on   Patient informed that his/her managed care company has contracts with or will negotiate with  certain facilities, including the following:     Patient/family informed of bed offers received:  10/01/2013 Patient chooses bed at  Physician recommends and patient chooses bed at    Patient to be transferred to  on   Patient to be transferred to facility by   The following physician request were entered in Epic:   Additional Comments:   Loletta SpecterSuzanna Matson Welch, MSW, LCSW Clinical Social Work [phone]

## 2013-09-30 NOTE — Progress Notes (Signed)
Physical Therapy Treatment Patient Details Name: Kendra Snyder MRN: [id number] DOB: [birth year] Today's Date: 09/30/2013 Time: 0925-0950 PT Time Calculation (min): 25 min  PT Assessment / Plan / Recommendation  History of Present Illness Kendra Snyder is a 79 y.o. female who does not followup with her doctor regularly, past medical history of remote right hip replacement, complicated by osteomyelitis, presents to the emergency room following a fall. She had a fall last night, was barely able to get up and then fell again. She apparently was on the floor for several hours before EMS was called. She denies any chest pain, shortness of breath, she denies any lightheadedness or dizziness. She endorses generalized weakness and generalized pain. She is now gone her pain, nausea, vomiting or diarrhea. She has no fever or chills.  Xray neg. for fx L hip and humerus.   PT Comments   Pt improved greatly with mobility today and MS. Pt will benefit from SNF.  Follow Up Recommendations  SNF;Supervision/Assistance - 24 hour     Does the patient have the potential to tolerate intense rehabilitation     Barriers to Discharge        Equipment Recommendations  None recommended by PT    Recommendations for Other Services    Frequency Min 3X/week   Progress towards PT Goals Progress towards PT goals: Progressing toward goals  Plan Current plan remains appropriate    Precautions / Restrictions Precautions Precautions: Fall Precaution Comments: incontinence- but better. use pads and brief Restrictions Weight Bearing Restrictions: No   Pertinent Vitals/Pain L hip/ lshoulder but using it more functionally today.    Mobility  Bed Mobility Bed Mobility: Supine to Sit;Sit to Supine Rolling: Supervision Supine to sit: Min assist Sit to supine: Min guard General bed mobility comments: support to get trunk upright and scooted to edge of bed. Transfers Overall transfer level: Needs  assistance Equipment used: Rolling walker (2 wheeled) Transfers: Sit to/from Stand Sit to Stand: Min assist General transfer comment: extra time for standing up, positioning LLE,  Ambulation/Gait Ambulation/Gait assistance: Min assist Ambulation Distance (Feet): 20 Feet Assistive device: Rolling walker (2 wheeled) Gait Pattern/deviations: Step-to pattern;Antalgic;Decreased step length - left;Decreased stance time - left Gait velocity: decreased General Gait Details: pt tends to stand and weight bear on L toes  with L Hip  internally rotated and knee flexed. Pt did attempt foot flat but increases pain in K hip    Exercises     PT Diagnosis:    PT Problem List:   PT Treatment Interventions:     PT Goals (current goals can now be found in the care plan section)    Visit Information  Last PT Received On: 09/30/13 Assistance Needed: +1 History of Present Illness: Kendra Snyder is a 79 y.o. female who does not followup with her doctor regularly, past medical history of remote right hip replacement, complicated by osteomyelitis, presents to the emergency room following a fall. She had a fall last night, was barely able to get up and then fell again. She apparently was on the floor for several hours before EMS was called. She denies any chest pain, shortness of breath, she denies any lightheadedness or dizziness. She endorses generalized weakness and generalized pain. She is now gone her pain, nausea, vomiting or diarrhea. She has no fever or chills.  Xray neg. for fx L hip and humerus.    Subjective Data      Cognition  Cognition Arousal/Alertness: Awake/alert Behavior During Therapy:  WFL for tasks assessed/performed Overall Cognitive Status: Within Functional Limits for tasks assessed General Comments: mucgh improved today.    Balance     End of Session PT - End of Session Equipment Utilized During Treatment: Gait belt Activity Tolerance: Patient tolerated treatment  well Patient left: in bed;with call bell/phone within reach;with bed alarm set (pt attempting to extend L Knee.) Nurse Communication: Mobility status   GP     Rada HayHill, Kortni Hasten Elizabeth 09/30/2013, 10:06 AM [phone]

## 2013-09-30 NOTE — Progress Notes (Signed)
Occupational Therapy Treatment Patient Details Name: Kendra Snyder MRN: [id number] DOB: 06/19/1935 Today's Date: 09/30/2013 Time: [id number] OT Time Calculation (min): 39 min  OT Assessment / Plan / Recommendation  History of present illness Kendra Snyder is a 79 y.o. female who does not followup with her doctor regularly, past medical history of remote right hip replacement, complicated by osteomyelitis, presents to the emergency room following a fall. She had a fall last night, was barely able to get up and then fell again. She apparently was on the floor for several hours before EMS was called. She denies any chest pain, shortness of breath, she denies any lightheadedness or dizziness. She endorses generalized weakness and generalized pain. She is now gone her pain, nausea, vomiting or diarrhea. She has no fever or chills.  Xray neg. for fx L hip and humerus.   OT comments  Pt progressing with ability to perform BADLs, but requires a significant amount of time to complete all activity.   Follow Up Recommendations  SNF;Supervision/Assistance - 24 hour    Barriers to Discharge       Equipment Recommendations  3 in 1 bedside comode;Wheelchair (measurements OT);Wheelchair cushion (measurements OT);Hospital bed    Recommendations for Other Services    Frequency Min 2X/week   Progress towards OT Goals Progress towards OT goals: Progressing toward goals  Plan Discharge plan remains appropriate    Precautions / Restrictions Precautions Precautions: Fall Precaution Comments: incontinence- but better. use pads and brief   Pertinent Vitals/Pain     ADL  Grooming: Brushing hair;Supervision/safety Where Assessed - Grooming: Unsupported sitting Toilet Transfer: Minimal assistance Toilet Transfer Method: Sit to stand;Stand pivot Toilet Transfer Equipment: Bedside commode Toileting - Clothing Manipulation and Hygiene: Minimal assistance Where Assessed - Toileting Clothing  Manipulation and Hygiene: Sit to stand from 3-in-1 or toilet Transfers/Ambulation Related to ADLs: min A ADL Comments: Pt moves very slowly requiring significantly increased time for all mobility and activity.  Pt. tearful EX:[id number]:orthopedice issues with hip - support provided    OT Diagnosis:    OT Problem List:   OT Treatment Interventions:     OT Goals(current goals can now be found in the care plan section) Acute Rehab OT Goals OT Goal Formulation: With patient Time For Goal Achievement: 10/13/13 ADL Goals Pt Will Perform Grooming: with min guard assist;sitting Pt Will Perform Upper Body Bathing: with min guard assist;sitting Pt Will Transfer to Toilet: with min guard assist;bedside commode Pt Will Perform Toileting - Clothing Manipulation and hygiene: with min assist Additional ADL Goal #1: Pt will complete bed mobilty min guard (A)  Visit Information  Last OT Received On: 09/30/13 Assistance Needed: +1 History of Present Illness: Kendra Snyder is a 79 y.o. female who does not followup with her doctor regularly, past medical history of remote right hip replacement, complicated by osteomyelitis, presents to the emergency room following a fall. She had a fall last night, was barely able to get up and then fell again. She apparently was on the floor for several hours before EMS was called. She denies any chest pain, shortness of breath, she denies any lightheadedness or dizziness. She endorses generalized weakness and generalized pain. She is now gone her pain, nausea, vomiting or diarrhea. She has no fever or chills.  Xray neg. for fx L hip and humerus.    Subjective Data      Prior Functioning       Cognition  Cognition Arousal/Alertness: Awake/alert Behavior During Therapy: WFL for tasks  assessed/performed Overall Cognitive Status: Within Functional Limits for tasks assessed Area of Impairment: Safety/judgement;Awareness;Problem solving Safety/Judgement: Decreased awareness  of deficits Problem Solving: Slow processing General Comments: mucgh improved today.    Mobility  Bed Mobility Overal bed mobility: Needs Assistance Bed Mobility: Supine to Sit;Sit to Supine Rolling: Supervision Supine to sit: Min guard Sit to supine: Min guard;Min assist General bed mobility comments: assist to scoot hips to EOB and assist to lift LEs onto bed.  Pt performed sit to supine x 1 with min a and min guard assist x 1.  Pt requires a significant amount of time Transfers Overall transfer level: Needs assistance Equipment used: 1 person hand held assist Transfers: Sit to/from [employer] Sit to Stand: Min assist Stand pivot transfers: Min assist General transfer comment: increased time and step by step cues for technique    Exercises      Balance Balance Overall balance assessment: Needs assistance Sitting-balance support: Feet supported Sitting balance-Leahy Scale: Fair Standing balance support: Single extremity supported Standing balance-Leahy Scale: Poor  End of Session OT - End of Session Activity Tolerance: Patient tolerated treatment well Patient left: in bed;with call bell/phone within reach;with bed alarm set Nurse Communication: Mobility status  GO     Kendra Snyder 09/30/2013, 11:53 AM

## 2013-10-01 LAB — CBC
HCT: 37.5 % (ref 36.0–46.0)
HEMOGLOBIN: 12.7 g/dL (ref 12.0–15.0)
MCH: 30.9 pg (ref 26.0–34.0)
MCHC: 33.9 g/dL (ref 30.0–36.0)
MCV: 91.2 fL (ref 78.0–100.0)
Platelets: 292 10*3/uL (ref 150–400)
RBC: 4.11 MIL/uL (ref 3.87–5.11)
RDW: 13.4 % (ref 11.5–15.5)
WBC: 6.4 10*3/uL (ref 4.0–10.5)

## 2013-10-01 LAB — URINE CULTURE
COLONY COUNT: NO GROWTH
CULTURE: NO GROWTH

## 2013-10-01 MED ORDER — DOCUSATE SODIUM 100 MG PO CAPS
100.0000 mg | ORAL_CAPSULE | Freq: Every day | ORAL | Status: DC
  Administered 2013-10-01 – 2013-10-06 (×4): 100 mg via ORAL
  Filled 2013-10-01 (×3): qty 1

## 2013-10-01 MED ORDER — POLYETHYLENE GLYCOL 3350 17 G PO PACK: 17.0000 g | PACK | Freq: Two times a day (BID) | ORAL | Status: DC

## 2013-10-01 MED ORDER — DOCUSATE SODIUM 100 MG PO CAPS: 100.0000 mg | ORAL_CAPSULE | Freq: Two times a day (BID) | ORAL | Status: DC

## 2013-10-01 MED ORDER — ENOXAPARIN SODIUM 40 MG/0.4ML ~~LOC~~ SOLN
40.0000 mg | SUBCUTANEOUS | Status: DC
  Administered 2013-10-01 – 2013-10-02 (×2): 40 mg via SUBCUTANEOUS
  Filled 2013-10-01 (×3): qty 0.4

## 2013-10-01 MED ORDER — OXYCODONE-ACETAMINOPHEN 5-325 MG PO TABS
1.0000 | ORAL_TABLET | ORAL | Status: DC | PRN
  Administered 2013-10-01 – 2013-10-03 (×4): 1 via ORAL
  Filled 2013-10-01 (×4): qty 1

## 2013-10-01 MED ORDER — POLYETHYLENE GLYCOL 3350 17 G PO PACK
17.0000 g | PACK | Freq: Every day | ORAL | Status: DC
  Administered 2013-10-01 – 2013-10-05 (×3): 17 g via ORAL
  Filled 2013-10-01 (×3): qty 1

## 2013-10-01 NOTE — Consult Note (Signed)
Reason for Consult: Severe left hip pain with initial concern for fracture after fall, advanced left hip OA Referring Physician:  Rama, MD  Kendra Snyder is an 79 y.o. female.  HPI: 79 yo female with history of previous right total hip replacement presented to the ER after fall with increased complaints of left hip.  She was admitted to hospital under Hospitalist service due to this increased pain for complete workup.  Work up was negative for fracture but conformed severe advanced OA of the left hip with femoral head collapse. We were initially asked after work up was completed to rule out hip fracture to see if cortisone would be of benefit.  Past Medical History  Diagnosis Date  . Renal disorder   . Blood transfusion   . Osteomyelitis   . Arthritis     Past Surgical History  Procedure Laterality Date  . Bladder surgery    . Total hip arthroplasty    . Tubal ligation    . Eye surgery    . Tonsillectomy      History reviewed. No pertinent family history.  Social History:  reports that she has never smoked. She does not have any smokeless tobacco history on file. She reports that she does not drink alcohol or use illicit drugs.  Allergies:  Allergies  Allergen Reactions  . Other Shortness Of Breath and Palpitations    Patient is allergic to all antibiotics  She states she has to be tested before receiving antibiotics   . Amoxicillin   . Iohexol      Desc: POSS IV CONTRAST ALLERGY NOTED ON CT A/P FROM 2005. UNABLE TO DEFINITELY CONFIRM W/ PT, PT'S FRIEND OR OFFICE. DONE W/O IV PER RAD ON 08/16/10, JB, Onset Date: [id number]   . Penicillins     Medications:  I have reviewed the patient's current medications. Scheduled: . ciprofloxacin  400 mg Intravenous Q12H  . docusate sodium  100 mg Oral Daily  . enoxaparin (LOVENOX) injection  40 mg Subcutaneous Q24H  . furosemide  20 mg Oral Daily  . polyethylene glycol  17 g Oral Daily  . potassium chloride  20 mEq Oral BID     Results for orders placed during the hospital encounter of 09/28/13 (from the past 24 hour(s))  OCCULT BLOOD X 1 CARD TO LAB, STOOL     Status: None   Collection Time    09/30/13  4:34 PM      Result Value Ref Range   Fecal Occult Bld NEGATIVE  NEGATIVE  CBC     Status: None   Collection Time    10/01/13  4:13 AM      Result Value Ref Range   WBC 6.4  4.0 - 10.5 K/uL   RBC 4.11  3.87 - 5.11 MIL/uL   Hemoglobin 12.7  12.0 - 15.0 g/dL   HCT 54.0  98.1 - 19.1 %   MCV 91.2  78.0 - 100.0 fL   MCH 30.9  26.0 - 34.0 pg   MCHC 33.9  30.0 - 36.0 g/dL   RDW 47.8  29.5 - 62.1 %   Platelets 292  150 - 400 K/uL     X-ray: CLINICAL DATA: Left hip pain, fall  EXAM:  LEFT HIP - COMPLETE 2+ VIEW  COMPARISON: 02/13/2009  FINDINGS:  Three views of the left hip submitted. There is diffuse osteopenia.  Again noted right hip prosthesis. No definite acute fracture is  identified. There is significant progression of degenerative  changes. Significant narrowing of superior left hip joint space.  Cystic and sclerotic changes are noted superior acetabulum.  Sclerotic changes are noted superior aspect of femoral head. There  is collapse and remodeling with partial impaction of superior aspect  of femoral head of indeterminate age. Clinical correlation is  necessary, further correlation with MRI could be performed as  clinically warranted.  IMPRESSION:  There is diffuse osteopenia. Again noted right hip prosthesis. No  definite acute fracture is identified. There is significant  progression of degenerative changes. Significant narrowing of  superior left hip joint space. Cystic and sclerotic changes are  noted superior acetabulum. Sclerotic changes are noted superior  aspect of femoral head. There is collapse and remodeling with  partial impaction of superior aspect of femoral head of  indeterminate age. Clinical correlation is necessary, further  correlation with MRI could be performed as  clinically warranted.  Electronically Signed  By: Natasha MeadLiviu Pop M.D.  On: 09/28/2013 09:07    ROS Other than that at the time of this presentation, no relevant concerns however she is a poor historian and non-compliant with regards to medical follow up  Blood pressure 136/53, pulse 69, temperature 98 F (36.7 C), temperature source Oral, resp. rate 16, height 4\' 11"  (1.499 m), weight 56.019 kg (123 lb 8 oz), SpO2 100.00%.  Physical Exam  Constitutional:  Overall seems thin and malnurished  HENT:  Head: Normocephalic and atraumatic.  Eyes: Pupils are equal, round, and reactive to light.  Cardiovascular: Normal rate.   Respiratory: Effort normal.  GI: Soft.  Musculoskeletal: She exhibits edema.  Shortened externally rotated left lower extremity Pain with movement  Neurological: She is alert.  At times during discussion she seems less than normal with regards to orientation but then she also reports what would be expected normal response  Skin: Skin is warm. No erythema.    Assessment/Plan: Severe left hip arthritis limiting function and causing her to fall related to shortening, pain Diminished activity related to hip and this is effecting her quality of life  After discussing things with her I have proposed replacing her left hip to improve her quality of life, help restore her function safely She wishes to proceed in this manner to improve her quality of life  She has had her right hip replaced in the past and we thus reviewed the risks of infection DVT dislocation and need for future surgeries Consent will be ordered to have this done Thursday am DVT prophylaxis until then with SCDs and Lovenox Tuesday and Wednesday   Rakim Moone D 10/01/2013, 10:06 AM

## 2013-10-01 NOTE — Progress Notes (Addendum)
[facility] PROGRESS NOTE  Kendra Snyder [facility] WUJ:[id number] DOB: 10-Aug-1934 DOA: 09/28/2013 PCP: [name], MD  Brief narrative: 79 y.o. female with PMH of remote right hip replacement complicated by osteomyelitis who was admitted to [facility] ED 09/28/13 status post fall. Left hip, left humerus films were without evidence of acute fracture. CT scan of the head was unremarkable. Plan is for ortho surgery on Thursday.  Assessment/Plan:   Principal Problem:  Fall with left hip and knee pain (in the setting of osteopenia and osteoarthritis), generalized weakness and fatigue  - Initial radiographs were negative for fracture. CT scan of the left hip negative for subtle fracture. - plan per ortho is for surgery on Thursday - PT eval - recommendation for SNF on discharge   Active Problems:  Black stools  - FOBT negative - no reports of blood in stool or black stool - continue Lovenox for DVT prophylaxis  FOOT PAIN  - Continue Toradol as needed.  UTI (lower urinary tract infection)  - UTI evidence on urinalysis - urine culture shows 10,000 colonies. Most likely this will show no growth - on empiric ciprofloxacin Bilateral leg edema  - 2-D echocardiogram done 09/29/13, EF 65-70%, no diastolic dysfunction. ProBNP WNL. Patient has only mild hypoalbuminemia.  - started on lasix 20 mg daily  Hypokalemia  - repleted and now WNL.  DVT Prophylaxis  - Continue Lovenox.   Code Status: Full.  Family Communication: Jari Favre [phone], updated by telephone.  Disposition Plan: Likely SNF on discahrge   IV access:  Peripheral IV. Medical Consultants:  Dr. Charlann Boxer, Orthopedics. Other Consultants:  Physical therapy  Occupational therapy: 24 hour care versus SNF recommended. Anti-infectives:  Cipro 09/28/13--->   Manson Passey, MD  [facility] Pager [phone]  If 7PM-7AM, please contact night-coverage [email or web address] Password [email or web address] 10/01/2013, 1:06 PM   LOS: 3 days     HPI/Subjective: Pain in suprapubic region.   Objective: Filed Vitals:   09/30/13 0440 09/30/13 1433 09/30/13 2055 10/01/13 0432  BP: 143/52 130/49 149/62 136/53  Pulse: 73 77 84 69  Temp: 98.1 F (36.7 C) 97.9 F (36.6 C) 97.8 F (36.6 C) 98 F (36.7 C)  TempSrc: Oral Oral Oral Oral  Resp: 18 16 16 16   Height:      Weight:      SpO2: 100% 100% 100% 100%    Intake/Output Summary (Last 24 hours) at 10/01/13 1306 Last data filed at 10/01/13 1135  Gross per 24 hour  Intake    320 ml  Output    200 ml  Net    120 ml    Exam:   General:  Pt is alert, follows commands appropriately, not in acute distress  Cardiovascular: Regular rate and rhythm, S1/S2, no murmurs, no rubs, no gallops  Respiratory: Clear to auscultation bilaterally, no wheezing, no crackles, no rhonchi  Abdomen: Soft, non tender, non distended, bowel sounds present, no guarding  Extremities: Pulses DP and PT palpable bilaterally  Neuro: Grossly nonfocal  Data Reviewed: Basic Metabolic Panel:  Recent Labs Lab 09/28/13 0800 09/29/13 0429 09/30/13 0350  NA 140 141 147  K 3.4* 3.5* 4.1  CL 100 102 109  CO2 29 28 27   GLUCOSE 111* 126* 103*  BUN 19 12 13   CREATININE 0.56 0.50 0.51  CALCIUM 9.6 8.6 9.0   Liver Function Tests:  Recent Labs Lab 09/28/13 0800  AST 22  ALT 17  ALKPHOS 115  BILITOT 0.4  PROT 6.4  ALBUMIN 3.4*   No  results found for this basename: LIPASE, AMYLASE,  in the last 168 hours No results found for this basename: AMMONIA,  in the last 168 hours CBC:  Recent Labs Lab 09/28/13 0800 09/29/13 0429 10/01/13 0413  WBC 7.6 5.9 6.4  NEUTROABS 5.7  --   --   HGB 12.9 11.8* 12.7  HCT 37.7 34.8* 37.5  MCV 90.2 91.6 91.2  PLT 277 251 292   Cardiac Enzymes:  Recent Labs Lab 09/28/13 0800  CKTOTAL 162  TROPONINI <0.30   BNP: No components found with this basename: POCBNP,  CBG: No results found for this basename: GLUCAP,  in the last 168 hours  No  results found for this or any previous visit (from the past 240 hour(s)).   Studies: Ct Hip Left Wo Contrast 09/30/2013  IMPRESSION: Severe progressive degenerative changes involving the left hip as described above.  No acute fracture.     Scheduled Meds: . ciprofloxacin  400 mg Intravenous Q12H  . docusate sodium  100 mg Oral Daily  . enoxaparin (LOVENOX)   40 mg Subcutaneous Q24H  . furosemide  20 mg Oral Daily  . polyethylene glycol  17 g Oral Daily  . potassium chloride  20 mEq Oral BID

## 2013-10-01 NOTE — Progress Notes (Signed)
ANTICOAGULATION CONSULT NOTE - Initial Consult  Pharmacy Consult for enoxaparin Indication: VTE prophylaxis  Allergies  Allergen Reactions  . Other Shortness Of Breath and Palpitations    Patient is allergic to all antibiotics  She states she has to be tested before receiving antibiotics   . Amoxicillin   . Iohexol      Desc: POSS IV CONTRAST ALLERGY NOTED ON CT A/P FROM 2005. UNABLE TO DEFINITELY CONFIRM W/ PT, PT'S FRIEND OR OFFICE. DONE W/O IV PER RAD ON 08/16/10, JB, Onset Date: [id number]   . Penicillins     Patient Measurements: Height: 4\' 11"  (149.9 cm) Weight: 123 lb 8 oz (56.019 kg) IBW/kg (Calculated) : 43.2  Vital Signs: Temp: 98 F (36.7 C) (03/03 0432) Temp src: Oral (03/03 0432) BP: 136/53 mmHg (03/03 0432) Pulse Rate: 69 (03/03 0432)  Labs:  Recent Labs  09/29/13 0429 09/30/13 0350 10/01/13 0413  HGB 11.8*  --  12.7  HCT 34.8*  --  37.5  PLT 251  --  292  CREATININE 0.50 0.51  --     Estimated Creatinine Clearance: 45.6 ml/min (by C-G formula based on Cr of 0.51).   Medical History: Past Medical History  Diagnosis Date  . Renal disorder   . Blood transfusion   . Osteomyelitis   . Arthritis     Medications:  Scheduled:  . ciprofloxacin  400 mg Intravenous Q12H  . furosemide  20 mg Oral Daily  . potassium chloride  20 mEq Oral BID    Assessment: [age] yoF admitted 2/28 after a fall. Pt has remote Hx R THA, complicated by osteomyelitis. Patient noted to be a poor historian. Pt was originally placed on enoxaparin 40mg  SQ q24 for VTE prophylaxis on admission which was discontinued on 3/2 secondary to the presence of darkened stools. Pharmacy has now been consulted to dose enoxaparin for VTE prophylaxis  FOBT 3/2 was NEGATIVE  CBC is stable, CrCl 45 ml/min and stable  LBM charted on 2/28   Goal of Therapy:  Anti-Xa level 0.6-1 units/ml 4hrs after LMWH dose given Monitor platelets by anticoagulation protocol: Yes   Plan:  - start  enoxaparin 40mg  SQ q24h for VTE ppx - pharmacy will sign-off from formal note writing but will continue to follow, CBC, renal function, stool characteristics daily  Thank you for the consult.  Tomi BambergerJesse Margeaux Swantek, PharmD, BCPS Pager: [phone] Pharmacy: [phone] 10/01/2013 11:08 AM

## 2013-10-01 NOTE — Progress Notes (Signed)
CSW continuing to follow for disposition.   Per chart, pt now planned for surgery on Thursday.  CSW met with pt at bedside. CSW discussed that CSW aware of plan for surgery on Thursday, but CSW wanted to provide bed offers in order for pt to consider options in order to make decision as SNF will continue to be recommendation following surgery.  CSW discussed SNF bed offers with pt and clarified pt questions. Pt plans to discuss bed offers with pt family and friends in order to make decision.   CSW did not receive return phone call from pt daughter yesterday and contacted pt daughter today and left voice message. Pt stated that she would update pt daughter as well.   CSW to continue to follow to assist with pt disposition needs.   [name], MSW, [place] [facility] [phone]

## 2013-10-01 NOTE — Progress Notes (Signed)
Chaplain provided presence ministry of presence to patient. Chaplain conversed with the patient and volunteer. Chaplain offered pastoral care and support. Chaplain will follow up if needed or requested.   10/01/13 1600  Clinical Encounter Type  Visited With Patient Programmer, applications(Volunteer )  Visit Type Initial

## 2013-10-02 MED ORDER — CLINDAMYCIN PHOSPHATE 900 MG/50ML IV SOLN
900.0000 mg | INTRAVENOUS | Status: DC
  Filled 2013-10-02: qty 50

## 2013-10-02 NOTE — Progress Notes (Signed)
Physical Therapy Treatment Patient Details Name: Kendra Snyder MRN: [id number] DOB: 22-Oct-1934 Today's Date: 10/02/2013 Time: 4098-1191 PT Time Calculation (min): 31 min  PT Assessment / Plan / Recommendation  History of Present Illness Kendra Snyder is a 79 y.o. female who does not followup with her doctor regularly, past medical history of remote right hip replacement, complicated by osteomyelitis, presents to the emergency room following a fall. She had a fall last night, was barely able to get up and then fell again. She apparently was on the floor for several hours before EMS was called. She denies any chest pain, shortness of breath, she denies any lightheadedness or dizziness. She endorses generalized weakness and generalized pain. She is now gone her pain, nausea, vomiting or diarrhea. She has no fever or chills.  Xray and CT neg. for fx L hip and humerus.   PT Comments   Pt ambulated as tolerated with RW.  Pt to have L THR scheduled for tomorrow so sat EOB with pt and answered her questions regarding rehab process after surgery and pt occasionally performing LE movement in sitting.  Pt happy to sit upright for awhile (states she's been in bed, no recliner or chair in room).  Pt educated to perform ankle pumps throughout the day and aware PT will check back with pt after hip surgery to assist with mobilization and exercises.   Follow Up Recommendations  SNF;Supervision/Assistance - 24 hour     Does the patient have the potential to tolerate intense rehabilitation     Barriers to Discharge        Equipment Recommendations  None recommended by PT    Recommendations for Other Services    Frequency Min 3X/week   Progress towards PT Goals Progress towards PT goals: Progressing toward goals  Plan Current plan remains appropriate    Precautions / Restrictions Precautions Precautions: Fall   Pertinent Vitals/Pain L hip pain with movement, activity as tolerated, repositioned to  comfort    Mobility  Bed Mobility Overal bed mobility: Needs Assistance Bed Mobility: Supine to Sit;Sit to Supine Supine to sit: Supervision Sit to supine: Min assist General bed mobility comments: increased time to perform however only required physical assist for final positioning of LEs upon return to supine Transfers Overall transfer level: Needs assistance Equipment used: Rolling walker (2 wheeled) Transfers: Sit to/from Stand Sit to Stand: Min assist General transfer comment: increased time, verbal cues for safe technique Ambulation/Gait Ambulation/Gait assistance: Min guard Ambulation Distance (Feet): 12 Feet Assistive device: Rolling walker (2 wheeled) Gait Pattern/deviations: Step-to pattern;Antalgic;Decreased step length - left;Decreased stance time - left Gait velocity: decreased Gait velocity interpretation: <1.8 ft/sec, indicative of risk for recurrent falls General Gait Details: maintains forefoot ambulation with increased knee flexion and hip IR with L LE during gait, had pt focus on distance rather then gait pattern due to likely change in gait once pt has surgery tomorrow however pt fatigues quickly    Exercises General Exercises - Lower Extremity Ankle Circles/Pumps: AROM;Both;10 reps Long Arc Quad: AROM;Right;15 reps;Seated   PT Diagnosis:    PT Problem List:   PT Treatment Interventions:     PT Goals (current goals can now be found in the care plan section)    Visit Information  Last PT Received On: 10/02/13 Assistance Needed: +1 History of Present Illness: Kendra Snyder is a 79 y.o. female who does not followup with her doctor regularly, past medical history of remote right hip replacement, complicated by osteomyelitis, presents  to the emergency room following a fall. She had a fall last night, was barely able to get up and then fell again. She apparently was on the floor for several hours before EMS was called. She denies any chest pain, shortness of  breath, she denies any lightheadedness or dizziness. She endorses generalized weakness and generalized pain. She is now gone her pain, nausea, vomiting or diarrhea. She has no fever or chills.  Xray and CT neg. for fx L hip and humerus.    Subjective Data      Cognition  Cognition Arousal/Alertness: Awake/alert Behavior During Therapy: WFL for tasks assessed/performed Overall Cognitive Status: Within Functional Limits for tasks assessed Area of Impairment: Awareness;Problem solving Problem Solving: Slow processing General Comments: pt reports difficulty finding words recently    Balance     End of Session PT - End of Session Equipment Utilized During Treatment: Gait belt Activity Tolerance: Patient limited by fatigue Patient left: in bed;with call bell/phone within reach   GP     Emonii Wienke,KATHrine E 10/02/2013, 12:45 PM Zenovia JarredKati Myquan Schaumburg, PT, DPT 10/02/2013 Pager: [phone]

## 2013-10-02 NOTE — Progress Notes (Signed)
CSW continuing to follow for disposition planning.  CSW met with pt at bedside to discuss decision for SNF. Pt discussed that she was interested in [facility].  CSW contacted [facility]. Per [facility], facility can accept pt, but facility is out of network with pt insurance. CSW discussed with Dustin Flock that CSW wanted to ensure that pt would be agreeable to out of network costs.  CSW met with pt at bedside to discuss and pt was agreeable to CSW contacting pt daughter via telephone in order for CSW speak with pt and pt daughter at the same time.   CSW discussed with pt and pt daughter that [facility] is out of network with pt insurance. CSW discussed the difference in in-network and out-of-network benefits.   Pt discussed that she was interested in exploring in-network options for SNF. CSW discussed the facilities in-network with pt insurance. Pt is interested in [facility], but facility did not offer pt a bed initially and CSW re-sent information and asked [facility] to re-review information to determine if [facility] would be able to offer a bed.   Pt plans to speak to pt friends in order to determine what secondary option would be between [facility] and [facility].   CSW to follow up with pt tomorrow to notify if [facility] determined if they were able to offer a bed and if not get pt decision between [facility] and [facility].  CSW to continue to follow for disposition planning.  [name], MSW, [facility] [phone]

## 2013-10-02 NOTE — Progress Notes (Signed)
   Subjective: Severe left hip osteoarthritis and pain  Patient reports pain as mild, while at rest in bed. Pain with any motion. No events throughout the night. We have discussed surgery including the risks, benefits and expectations. She understands and wishes to proceed with surgery, doesn't feel that she can continue with the pain in the left hip.  Objective:   VITALS:   Filed Vitals:   10/02/13 1448  BP: 127/47  Pulse: 81  Temp: 98.3 F (36.8 C)  Resp: 16    Neurovascular intact Dorsiflexion/Plantar flexion intact No cellulitis present Compartment soft  LABS  Recent Labs  10/01/13 0413  HGB 12.7  HCT 37.5  WBC 6.4  PLT 292     Recent Labs  09/30/13 0350  NA 147  K 4.1  BUN 13  CREATININE 0.51  GLUCOSE 103*     Assessment/Plan: Severe left hip osteoarthritis and pain   Plan for left total hip arthroplasty tomorrow AM per Dr. Charlann Boxerlin.  NPO after midnight.  Will write order to obtain consent  Risks, benefits and expectations were discussed with the patient.  Risks including but not limited to the risk of anesthesia, blood clots, nerve damage, blood vessel damage, failure of the prosthesis, infection and up to and including death.  Patient understand the risks, benefits and expectations and wishes to proceed with surgery.     Anastasio AuerbachMatthew S. Dustine Bertini   PAC  10/02/2013, 5:00 PM

## 2013-10-02 NOTE — Progress Notes (Addendum)
[facility] PROGRESS NOTE  Kendra PouchJulia M [facility] Surgery CenterMalanowski WUJ:[id number] DOB: 08/16/1934 DOA: 09/28/2013 PCP: Sonda PrimesAlex Plotnikov, MD  Brief narrative: 79 y.o. female with PMH of remote right hip replacement complicated by osteomyelitis who [facility] status post fall. Left hip, left humerus films were without evidence of acute fracture. CT scan of the head was unremarkable. Plan is for ortho surgery on Thursday.   Assessment/Plan:   Principal Problem:  Fall with left hip and knee pain (in the setting of osteopenia and osteoarthritis), generalized weakness and fatigue  - Initial radiographs were negative for fracture. CT scan of the left hip negative for subtle fracture.  - plan per ortho is for surgery on Thursday  - PT eval - recommendation for SNF on discharge  Active Problems:  Black stools  - FOBT negative  - no reports of blood in stool or black stool  - continue Lovenox for DVT prophylaxis  FOOT PAIN  - Continue Toradol as needed. Added percocet PO PRN for better pain relief and pt reports it really helped with pain relief UTI (lower urinary tract infection)  - UTI evidence on urinalysis but urine culture shows no growth. Pt has received 5 days of Cipro so will stop it today. Bilateral leg edema  - 2-D echocardiogram done 09/29/13, EF 65-70%, no diastolic dysfunction. ProBNP WNL. Patient has only mild hypoalbuminemia.  - started on lasix 20 mg daily but due to low diastolic BP I think it is reasonable to stop lasix. BP 130/40 Hypokalemia  - repleted and now WNL.  DVT Prophylaxis  - Continue Lovenox.   Code Status: Full.  Family Communication: Kendra FavreSherry Snyder [phone], updated by telephone.  Disposition Plan: Likely SNF on discahrge   IV access:  Peripheral IV. Medical Consultants:  Dr. Charlann Boxerlin, Orthopedics. Other Consultants:  Physical therapy  Occupational therapy. Anti-infectives:  Cipro 09/28/13---> 10/02/2013   Manson PasseyEVINE, ALMA, MD  [facility] Pager  [phone]  If 7PM-7AM, please contact night-coverage [email or web address] Password [email or web address] 10/02/2013, 10:07 AM   LOS: 4 days    HPI/Subjective: No acute overnight events.   Objective: Filed Vitals:   10/01/13 0432 10/01/13 1427 10/01/13 2001 10/02/13 0526  BP: 136/53 130/44 125/40 130/40  Pulse: 69 69 77 67  Temp: 98 F (36.7 C) 97.5 F (36.4 C) 98.1 F (36.7 C) 98.3 F (36.8 C)  TempSrc: Oral Oral Oral Oral  Resp: 16 18 18 18   Height:      Weight:      SpO2: 100% 100% 98% 97%    Intake/Output Summary (Last 24 hours) at 10/02/13 1007 Last data filed at 10/02/13 1000  Gross per 24 hour  Intake    880 ml  Output   1775 ml  Net   -895 ml    Exam:   General:  Pt is alert, follows commands appropriately, not in acute distress  Cardiovascular: Regular rate and rhythm, S1/S2 appreciated  Respiratory: Clear to auscultation bilaterally, no wheezing, no crackles, no rhonchi  Abdomen: Soft, non tender, non distended, bowel sounds present, no guarding  Extremities: LE edema improving, pulses DP and PT palpable bilaterally  Neuro: Grossly nonfocal  Data Reviewed: Basic Metabolic Panel:  Recent Labs Lab 09/28/13 0800 09/29/13 0429 09/30/13 0350  NA 140 141 147  K 3.4* 3.5* 4.1  CL 100 102 109  CO2 29 28 27   GLUCOSE 111* 126* 103*  BUN 19 12 13   CREATININE 0.56 0.50 0.51  CALCIUM 9.6 8.6 9.0   Liver Function Tests:  Recent Labs Lab 09/28/13 0800  AST 22  ALT 17  ALKPHOS 115  BILITOT 0.4  PROT 6.4  ALBUMIN 3.4*   No results found for this basename: LIPASE, AMYLASE,  in the last 168 hours No results found for this basename: AMMONIA,  in the last 168 hours CBC:  Recent Labs Lab 09/28/13 0800 09/29/13 0429 10/01/13 0413  WBC 7.6 5.9 6.4  NEUTROABS 5.7  --   --   HGB 12.9 11.8* 12.7  HCT 37.7 34.8* 37.5  MCV 90.2 91.6 91.2  PLT 277 251 292   Cardiac Enzymes:  Recent Labs Lab 09/28/13 0800  CKTOTAL 162  TROPONINI <0.30   BNP: No components  found with this basename: POCBNP,  CBG: No results found for this basename: GLUCAP,  in the last 168 hours  Recent Results (from the past 240 hour(s))  URINE CULTURE     Status: None   Collection Time    09/30/13 11:49 AM      Result Value Ref Range Status   Specimen Description URINE, RANDOM   Final   Value: NO GROWTH     Performed at [facility]   Report Status 10/01/2013 FINAL   Final     Studies: No results found.  Scheduled Meds: . docusate sodium  100 mg Oral Daily  . enoxaparin (LOVENOX) injection  40 mg Subcutaneous Q24H  . polyethylene glycol  17 g Oral Daily  . potassium chloride  20 mEq Oral BID

## 2013-10-03 ENCOUNTER — Encounter (HOSPITAL_COMMUNITY)
Admission: EM | Disposition: A | Discharge status: Skilled Nursing Facility | Payer: Self-pay | Source: Home / Self Care | Attending: Internal Medicine

## 2013-10-03 ENCOUNTER — Inpatient Hospital Stay (HOSPITAL_COMMUNITY): Payer: Medicare HMO | Admitting: Certified Registered Nurse Anesthetist

## 2013-10-03 ENCOUNTER — Encounter (HOSPITAL_COMMUNITY): Payer: Self-pay | Admitting: Certified Registered Nurse Anesthetist

## 2013-10-03 ENCOUNTER — Inpatient Hospital Stay (HOSPITAL_COMMUNITY): Payer: Medicare HMO

## 2013-10-03 ENCOUNTER — Encounter (HOSPITAL_COMMUNITY): Payer: Medicare HMO | Admitting: Certified Registered Nurse Anesthetist

## 2013-10-03 DIAGNOSIS — E876 Hypokalemia: Secondary | ICD-10-CM | POA: Diagnosis not present

## 2013-10-03 DIAGNOSIS — M169 Osteoarthritis of hip, unspecified: Secondary | ICD-10-CM | POA: Diagnosis not present

## 2013-10-03 DIAGNOSIS — M1610 Unilateral primary osteoarthritis, unspecified hip: Secondary | ICD-10-CM | POA: Diagnosis not present

## 2013-10-03 DIAGNOSIS — N390 Urinary tract infection, site not specified: Secondary | ICD-10-CM | POA: Diagnosis not present

## 2013-10-03 DIAGNOSIS — E8809 Other disorders of plasma-protein metabolism, not elsewhere classified: Secondary | ICD-10-CM | POA: Diagnosis not present

## 2013-10-03 DIAGNOSIS — Z96649 Presence of unspecified artificial hip joint: Secondary | ICD-10-CM

## 2013-10-03 HISTORY — PX: TOTAL HIP ARTHROPLASTY: SHX124

## 2013-10-03 LAB — SURGICAL PCR SCREEN
MRSA, PCR: NEGATIVE
STAPHYLOCOCCUS AUREUS: NEGATIVE

## 2013-10-03 LAB — TYPE AND SCREEN
ABO/RH(D): A POS
ANTIBODY SCREEN: NEGATIVE

## 2013-10-03 LAB — ABO/RH: ABO/RH(D): A POS

## 2013-10-03 SURGERY — ARTHROPLASTY, HIP, TOTAL, ANTERIOR APPROACH
Anesthesia: General | Site: Hip | Laterality: Left

## 2013-10-03 MED ORDER — 0.9 % SODIUM CHLORIDE (POUR BTL) OPTIME
TOPICAL | Status: DC | PRN
  Administered 2013-10-03: 1000 mL

## 2013-10-03 MED ORDER — EPHEDRINE SULFATE 50 MG/ML IJ SOLN
INTRAMUSCULAR | Status: AC
  Filled 2013-10-03: qty 1

## 2013-10-03 MED ORDER — LACTATED RINGERS IV SOLN: Freq: Once | INTRAVENOUS | Status: DC

## 2013-10-03 MED ORDER — ACETAMINOPHEN 10 MG/ML IV SOLN
1000.0000 mg | Freq: Once | INTRAVENOUS | Status: AC
  Administered 2013-10-03: 1000 mg via INTRAVENOUS
  Filled 2013-10-03: qty 100

## 2013-10-03 MED ORDER — PROPOFOL 10 MG/ML IV BOLUS
INTRAVENOUS | Status: AC
  Filled 2013-10-03: qty 20

## 2013-10-03 MED ORDER — ONDANSETRON HCL 4 MG/2ML IJ SOLN
INTRAMUSCULAR | Status: AC
  Filled 2013-10-03: qty 2

## 2013-10-03 MED ORDER — PROPOFOL 10 MG/ML IV BOLUS
INTRAVENOUS | Status: DC | PRN
  Administered 2013-10-03: 100 mg via INTRAVENOUS

## 2013-10-03 MED ORDER — OXYCODONE-ACETAMINOPHEN 5-325 MG PO TABS: 1.0000 | ORAL_TABLET | ORAL | Status: DC | PRN

## 2013-10-03 MED ORDER — MORPHINE SULFATE 2 MG/ML IJ SOLN
0.5000 mg | INTRAMUSCULAR | Status: DC | PRN
  Administered 2013-10-04: 0.5 mg via INTRAVENOUS
  Filled 2013-10-03: qty 1

## 2013-10-03 MED ORDER — PROMETHAZINE HCL 25 MG/ML IJ SOLN: 6.2500 mg | INTRAMUSCULAR | Status: DC | PRN

## 2013-10-03 MED ORDER — PHENYLEPHRINE HCL 10 MG/ML IJ SOLN
10.0000 mg | INTRAVENOUS | Status: DC | PRN
  Administered 2013-10-03: 50 ug/min via INTRAVENOUS

## 2013-10-03 MED ORDER — PHENOL 1.4 % MT LIQD: 1.0000 | OROMUCOSAL | Status: DC | PRN

## 2013-10-03 MED ORDER — FENTANYL CITRATE 0.05 MG/ML IJ SOLN
INTRAMUSCULAR | Status: AC
  Filled 2013-10-03: qty 2

## 2013-10-03 MED ORDER — MIDAZOLAM HCL 5 MG/5ML IJ SOLN
INTRAMUSCULAR | Status: DC | PRN
  Administered 2013-10-03: 2 mg via INTRAVENOUS

## 2013-10-03 MED ORDER — MEPERIDINE HCL 50 MG/ML IJ SOLN: 6.2500 mg | INTRAMUSCULAR | Status: DC | PRN

## 2013-10-03 MED ORDER — PHENYLEPHRINE HCL 10 MG/ML IJ SOLN
INTRAMUSCULAR | Status: AC
  Filled 2013-10-03: qty 1

## 2013-10-03 MED ORDER — MENTHOL 3 MG MT LOZG: 1.0000 | LOZENGE | OROMUCOSAL | Status: DC | PRN

## 2013-10-03 MED ORDER — EPHEDRINE SULFATE 50 MG/ML IJ SOLN
INTRAMUSCULAR | Status: DC | PRN
  Administered 2013-10-03 (×2): 5 mg via INTRAVENOUS
  Administered 2013-10-03: 10 mg via INTRAVENOUS

## 2013-10-03 MED ORDER — OXYCODONE-ACETAMINOPHEN 5-325 MG PO TABS
1.0000 | ORAL_TABLET | ORAL | Status: DC | PRN
  Administered 2013-10-04 (×4): 1 via ORAL
  Administered 2013-10-05: 2 via ORAL
  Administered 2013-10-05 – 2013-10-06 (×3): 1 via ORAL
  Filled 2013-10-03: qty 1
  Filled 2013-10-03: qty 2
  Filled 2013-10-03 (×7): qty 1

## 2013-10-03 MED ORDER — ROCURONIUM BROMIDE 100 MG/10ML IV SOLN
INTRAVENOUS | Status: DC | PRN
  Administered 2013-10-03: 50 mg via INTRAVENOUS

## 2013-10-03 MED ORDER — ASPIRIN EC 325 MG PO TBEC
325.0000 mg | DELAYED_RELEASE_TABLET | Freq: Two times a day (BID) | ORAL | Status: DC
  Administered 2013-10-04 – 2013-10-06 (×5): 325 mg via ORAL
  Filled 2013-10-03 (×7): qty 1

## 2013-10-03 MED ORDER — METOCLOPRAMIDE HCL 5 MG/ML IJ SOLN: 5.0000 mg | Freq: Three times a day (TID) | INTRAMUSCULAR | Status: DC | PRN

## 2013-10-03 MED ORDER — LIDOCAINE HCL (CARDIAC) 20 MG/ML IV SOLN
INTRAVENOUS | Status: AC
  Filled 2013-10-03: qty 5

## 2013-10-03 MED ORDER — LACTATED RINGERS IV SOLN
INTRAVENOUS | Status: DC | PRN
  Administered 2013-10-03 (×2): via INTRAVENOUS

## 2013-10-03 MED ORDER — FENTANYL CITRATE 0.05 MG/ML IJ SOLN
INTRAMUSCULAR | Status: AC
  Filled 2013-10-03: qty 5

## 2013-10-03 MED ORDER — FENTANYL CITRATE 0.05 MG/ML IJ SOLN
25.0000 ug | INTRAMUSCULAR | Status: DC | PRN
  Administered 2013-10-03 (×2): 50 ug via INTRAVENOUS

## 2013-10-03 MED ORDER — ONDANSETRON HCL 4 MG PO TABS: 4.0000 mg | ORAL_TABLET | Freq: Four times a day (QID) | ORAL | Status: DC | PRN

## 2013-10-03 MED ORDER — CLINDAMYCIN PHOSPHATE 600 MG/50ML IV SOLN
600.0000 mg | Freq: Four times a day (QID) | INTRAVENOUS | Status: AC
  Administered 2013-10-03 – 2013-10-04 (×2): 600 mg via INTRAVENOUS
  Filled 2013-10-03 (×2): qty 50

## 2013-10-03 MED ORDER — FERROUS SULFATE 325 (65 FE) MG PO TABS
325.0000 mg | ORAL_TABLET | Freq: Three times a day (TID) | ORAL | Status: DC
  Administered 2013-10-04 – 2013-10-06 (×7): 325 mg via ORAL
  Filled 2013-10-03 (×12): qty 1

## 2013-10-03 MED ORDER — ASPIRIN EC 325 MG PO TBEC: 325.0000 mg | DELAYED_RELEASE_TABLET | Freq: Two times a day (BID) | ORAL | Status: DC

## 2013-10-03 MED ORDER — GLYCOPYRROLATE 0.2 MG/ML IJ SOLN
INTRAMUSCULAR | Status: DC | PRN
  Administered 2013-10-03: 0.4 mg via INTRAVENOUS

## 2013-10-03 MED ORDER — METOCLOPRAMIDE HCL 10 MG PO TABS: 5.0000 mg | ORAL_TABLET | Freq: Three times a day (TID) | ORAL | Status: DC | PRN

## 2013-10-03 MED ORDER — NEOSTIGMINE METHYLSULFATE 1 MG/ML IJ SOLN
INTRAMUSCULAR | Status: DC | PRN
  Administered 2013-10-03: 3 mg via INTRAVENOUS

## 2013-10-03 MED ORDER — SODIUM CHLORIDE 0.9 % IJ SOLN
INTRAMUSCULAR | Status: AC
  Filled 2013-10-03: qty 10

## 2013-10-03 MED ORDER — LIDOCAINE HCL (CARDIAC) 20 MG/ML IV SOLN
INTRAVENOUS | Status: DC | PRN
  Administered 2013-10-03: 75 mg via INTRAVENOUS

## 2013-10-03 MED ORDER — MIDAZOLAM HCL 2 MG/2ML IJ SOLN
INTRAMUSCULAR | Status: AC
  Filled 2013-10-03: qty 2

## 2013-10-03 MED ORDER — FENTANYL CITRATE 0.05 MG/ML IJ SOLN
INTRAMUSCULAR | Status: DC | PRN
  Administered 2013-10-03 (×4): 50 ug via INTRAVENOUS
  Administered 2013-10-03: 100 ug via INTRAVENOUS
  Administered 2013-10-03: 50 ug via INTRAVENOUS

## 2013-10-03 MED ORDER — CLINDAMYCIN PHOSPHATE 900 MG/50ML IV SOLN
900.0000 mg | INTRAVENOUS | Status: AC
  Administered 2013-10-03: 900 mg via INTRAVENOUS
  Filled 2013-10-03: qty 50

## 2013-10-03 MED ORDER — POLYETHYLENE GLYCOL 3350 17 G PO PACK: 17.0000 g | PACK | Freq: Every day | ORAL | Status: DC | PRN

## 2013-10-03 MED ORDER — ONDANSETRON HCL 4 MG/2ML IJ SOLN: 4.0000 mg | Freq: Four times a day (QID) | INTRAMUSCULAR | Status: DC | PRN

## 2013-10-03 MED ORDER — DEXAMETHASONE SODIUM PHOSPHATE 10 MG/ML IJ SOLN
INTRAMUSCULAR | Status: DC | PRN
  Administered 2013-10-03: 10 mg via INTRAVENOUS

## 2013-10-03 SURGICAL SUPPLY — 44 items
ADH SKN CLS APL DERMABOND .7 (GAUZE/BANDAGES/DRESSINGS) ×1
BAG SPEC THK2 15X12 ZIP CLS (MISCELLANEOUS)
BAG ZIPLOCK 12X15 (MISCELLANEOUS) IMPLANT
BLADE SAW SGTL 18X1.27X75 (BLADE) ×2 IMPLANT
BLADE SAW SGTL 18X1.27X75MM (BLADE) ×1
CAPT HIP PF MOP ×2 IMPLANT
DERMABOND ADVANCED (GAUZE/BANDAGES/DRESSINGS) ×2
DERMABOND ADVANCED .7 DNX12 (GAUZE/BANDAGES/DRESSINGS) ×1 IMPLANT
DRAPE C-ARM 42X120 X-RAY (DRAPES) ×3 IMPLANT
DRAPE INCISE 23X17 IOBAN STRL (DRAPES) ×2
DRAPE INCISE 23X17 STRL (DRAPES) IMPLANT
DRAPE INCISE IOBAN 23X17 STRL (DRAPES) ×1 IMPLANT
DRAPE STERI IOBAN 125X83 (DRAPES) ×1 IMPLANT
DRAPE SURG ISO 125X83 STRL (DRAPES) ×2 IMPLANT
DRAPE U-SHAPE 47X51 STRL (DRAPES) ×9 IMPLANT
DRSG AQUACEL AG ADV 3.5X10 (GAUZE/BANDAGES/DRESSINGS) ×3 IMPLANT
DRSG TEGADERM 4X4.75 (GAUZE/BANDAGES/DRESSINGS) IMPLANT
DURAPREP 26ML APPLICATOR (WOUND CARE) ×3 IMPLANT
ELECT BLADE TIP CTD 4 INCH (ELECTRODE) ×3 IMPLANT
ELECT REM PT RETURN 9FT ADLT (ELECTROSURGICAL) ×3
ELECTRODE REM PT RTRN 9FT ADLT (ELECTROSURGICAL) ×1 IMPLANT
EVACUATOR 1/8 PVC DRAIN (DRAIN) IMPLANT
FACESHIELD LNG OPTICON STERILE (SAFETY) ×16 IMPLANT
GAUZE SPONGE 2X2 8PLY STRL LF (GAUZE/BANDAGES/DRESSINGS) IMPLANT
GLOVE BIOGEL PI IND STRL 7.5 (GLOVE) ×1 IMPLANT
GLOVE BIOGEL PI IND STRL 8 (GLOVE) ×1 IMPLANT
GLOVE BIOGEL PI INDICATOR 7.5 (GLOVE) ×2
GLOVE BIOGEL PI INDICATOR 8 (GLOVE) ×2
GLOVE ECLIPSE 8.0 STRL XLNG CF (GLOVE) ×3 IMPLANT
GLOVE ORTHO TXT STRL SZ7.5 (GLOVE) ×6 IMPLANT
GOWN SPEC L3 XXLG W/TWL (GOWN DISPOSABLE) ×7 IMPLANT
GOWN STRL REUS W/TWL LRG LVL3 (GOWN DISPOSABLE) ×3 IMPLANT
KIT BASIN OR (CUSTOM PROCEDURE TRAY) ×3 IMPLANT
PACK TOTAL JOINT (CUSTOM PROCEDURE TRAY) ×3 IMPLANT
PADDING CAST COTTON 6X4 STRL (CAST SUPPLIES) ×3 IMPLANT
SPONGE GAUZE 2X2 STER 10/PKG (GAUZE/BANDAGES/DRESSINGS)
SUT MNCRL AB 4-0 PS2 18 (SUTURE) ×3 IMPLANT
SUT VIC AB 1 CT1 36 (SUTURE) ×9 IMPLANT
SUT VIC AB 2-0 CT1 27 (SUTURE) ×6
SUT VIC AB 2-0 CT1 TAPERPNT 27 (SUTURE) ×2 IMPLANT
SUT VLOC 180 0 24IN GS25 (SUTURE) ×3 IMPLANT
TOWEL OR 17X26 10 PK STRL BLUE (TOWEL DISPOSABLE) ×3 IMPLANT
TOWEL OR NON WOVEN STRL DISP B (DISPOSABLE) IMPLANT
TRAY FOLEY CATH 14FRSI W/METER (CATHETERS) ×3 IMPLANT

## 2013-10-03 NOTE — Transfer of Care (Signed)
Immediate Anesthesia Transfer of Care Note  Patient: Kendra Snyder  Procedure(s) Performed: Procedure(s) (LRB): TOTAL LEFT HIP ARTHROPLASTY ANTERIOR APPROACH (Left)  Patient Location: PACU  Anesthesia Type: General  Level of Consciousness: sedated, patient cooperative and responds to stimulation  Airway & Oxygen Therapy: Patient Spontanous Breathing and Patient connected to face mask oxgen  Post-op Assessment: Report given to PACU RN and Post -op Vital signs reviewed and stable  Post vital signs: Reviewed and stable  Complications: No apparent anesthesia complications

## 2013-10-03 NOTE — Anesthesia Preprocedure Evaluation (Addendum)
Anesthesia Evaluation  Patient identified by MRN, date of birth, ID band Patient awake and Patient confused    Reviewed: Allergy & Precautions, H&P , NPO status , Patient's Chart, lab work & pertinent test results  Airway Mallampati: II TM Distance: >3 FB Neck ROM: Full    Dental no notable dental hx. (+) Poor Dentition   Pulmonary neg pulmonary ROS,  breath sounds clear to auscultation  Pulmonary exam normal       Cardiovascular negative cardio ROS  Rhythm:Regular Rate:Normal     Neuro/Psych Dementia negative neurological ROS  negative psych ROS   GI/Hepatic negative GI ROS, Neg liver ROS,   Endo/Other  negative endocrine ROS  Renal/GU negative Renal ROS  negative genitourinary   Musculoskeletal negative musculoskeletal ROS (+)   Abdominal   Peds negative pediatric ROS (+)  Hematology negative hematology ROS (+)   Anesthesia Other Findings   Reproductive/Obstetrics negative OB ROS                           Anesthesia Physical Anesthesia Plan  ASA: II  Anesthesia Plan: General   Post-op Pain Management:    Induction: Intravenous  Airway Management Planned: Oral ETT  Additional Equipment:   Intra-op Plan:   Post-operative Plan: Extubation in OR  Informed Consent: I have reviewed the patients History and Physical, chart, labs and discussed the procedure including the risks, benefits and alternatives for the proposed anesthesia with the patient or authorized representative who has indicated his/her understanding and acceptance.   Dental advisory given  Plan Discussed with: CRNA  Anesthesia Plan Comments:         Anesthesia Quick Evaluation

## 2013-10-03 NOTE — Anesthesia Postprocedure Evaluation (Signed)
  Anesthesia Post-op Note  Patient: Kendra Snyder  Procedure(s) Performed: Procedure(s) (LRB): TOTAL LEFT HIP ARTHROPLASTY ANTERIOR APPROACH (Left)  Patient Location: PACU  Anesthesia Type: General  Level of Consciousness: awake and alert   Airway and Oxygen Therapy: Patient Spontanous Breathing  Post-op Pain: mild  Post-op Assessment: Post-op Vital signs reviewed, Patient's Cardiovascular Status Stable, Respiratory Function Stable, Patent Airway and No signs of Nausea or vomiting  Last Vitals:  Filed Vitals:   10/03/13 1230  BP: 123/52  Pulse: 60  Temp: 36.4 C  Resp: 14    Post-op Vital Signs: stable   Complications: No apparent anesthesia complications

## 2013-10-03 NOTE — Progress Notes (Signed)
[facility] PROGRESS NOTE  Kendra Snyder [facility] ZOX:[id number] DOB: 28-Feb-1935 DOA: 09/28/2013 PCP: [name], MD  Brief narrative: 79 y.o. female with PMH of remote right hip replacement complicated by osteomyelitis who was admitted to [facility] ED 09/28/13 status post fall. Left hip, left humerus films were without evidence of acute fracture. CT scan of the head was unremarkable. Plan is for ortho surgery today.  Assessment/Plan:   Principal Problem:  Fall with left hip and knee pain (in the setting of osteopenia and osteoarthritis), generalized weakness and fatigue  - Initial radiographs were negative for fracture. CT scan of the left hip negative for subtle fracture.  - plan per ortho is for surgery today - pt will be discharged to SNF once stable Active Problems:  Black stools  - FOBT negative  - no reports of blood in stool or black stool  - continue Lovenox for DVT prophylaxis  FOOT PAIN  - Continue Toradol and percocet as needed.  UTI (lower urinary tract infection)  - UTI evidence on urinalysis but urine culture showed no growth. Pt has received 5 days of Cipro (stopped 3/4/015) Bilateral leg edema  - 2-D echocardiogram done 09/29/13, EF 65-70%, no diastolic dysfunction. ProBNP WNL. Patient has only mild hypoalbuminemia. We stopped lasix 3/4 due to low diastolic BP on 3/4; This am BP 145/72 so we may be able to resume it after the surgery  Hypokalemia  - repleted and now WNL.  DVT Prophylaxis  - Continue Lovenox.   Code Status: Full.  Family Communication: Jari Favre [phone], updated by telephone.  Disposition Plan: Likely SNF on discahrge   IV access:  Peripheral IV. Medical Consultants:  Dr. Charlann Boxer, Orthopedics. Other Consultants:  Physical therapy  Occupational therapy. Anti-infectives:  Cipro 09/28/13---> 10/02/2013  Manson Passey, MD  [facility] Pager [phone]  If 7PM-7AM, please contact night-coverage [email or web address] Password [email or web address] 10/03/2013,  1:08 PM   LOS: 5 days    HPI/Subjective: No acute overnight events.   Objective: Filed Vitals:   10/02/13 1448 10/02/13 2114 10/03/13 0430 10/03/13 1230  BP: 127/47 139/40 130/50 123/52  Pulse: 81 85 74 60  Temp: 98.3 F (36.8 C) 97.5 F (36.4 C) 98.2 F (36.8 C) 97.6 F (36.4 C)  TempSrc: Oral Oral Oral   Resp: 16 16 16 14   Height:      Weight:      SpO2: 98% 98% 98% 100%    Intake/Output Summary (Last 24 hours) at 10/03/13 1308 Last data filed at 10/03/13 1234  Gross per 24 hour  Intake   1860 ml  Output   1350 ml  Net    510 ml    Exam:   General:  Pt is alert, follows commands appropriately, not in acute distress  Cardiovascular: Regular rate and rhythm, S1/S2, no murmurs, no rubs, no gallops  Respiratory: Clear to auscultation bilaterally, no wheezing, no crackles, no rhonchi  Abdomen: Soft, non tender, non distended, bowel sounds present, no guarding  Extremities: Pulses DP and PT palpable bilaterally  Neuro: Grossly nonfocal  Data Reviewed: Basic Metabolic Panel:  Recent Labs Lab 09/28/13 0800 09/29/13 0429 09/30/13 0350  NA 140 141 147  K 3.4* 3.5* 4.1  CL 100 102 109  CO2 29 28 27   GLUCOSE 111* 126* 103*  BUN 19 12 13   CREATININE 0.56 0.50 0.51  CALCIUM 9.6 8.6 9.0   Liver Function Tests:  Recent Labs Lab 09/28/13 0800  AST 22  ALT 17  ALKPHOS 115  BILITOT 0.4  PROT 6.4  ALBUMIN 3.4*   No results found for this basename: LIPASE, AMYLASE,  in the last 168 hours No results found for this basename: AMMONIA,  in the last 168 hours CBC:  Recent Labs Lab 09/28/13 0800 09/29/13 0429 10/01/13 0413  WBC 7.6 5.9 6.4  NEUTROABS 5.7  --   --   HGB 12.9 11.8* 12.7  HCT 37.7 34.8* 37.5  MCV 90.2 91.6 91.2  PLT 277 251 292   Cardiac Enzymes:  Recent Labs Lab 09/28/13 0800  CKTOTAL 162  TROPONINI <0.30   BNP: No components found with this basename: POCBNP,  CBG: No results found for this basename: GLUCAP,  in the last  168 hours  URINE CULTURE     Status: None   Collection Time    09/30/13 11:49 AM      Result Value Ref Range Status   Specimen Description URINE, RANDOM   Final   Value: NO GROWTH     [facility] at [facility]   Report Status 10/01/2013 FINAL   Final  SURGICAL PCR SCREEN     Status: None   Collection Time    10/03/13  5:50 AM      Result Value Ref Range Status   MRSA, PCR NEGATIVE  NEGATIVE Final   Staphylococcus aureus NEGATIVE  NEGATIVE Final     Studies: No results found.  Scheduled Meds: . [facility][MAR HOLD] docusate sodium  100 mg Oral Daily  . [MAR HOLD] enoxaparin (LOVENOX) injection  40 mg Subcutaneous Q24H  . fentaNYL      . [MAR HOLD] polyethylene glycol  17 g Oral Daily  . [facility][MAR HOLD] potassium chloride  20 mEq Oral BID

## 2013-10-03 NOTE — Plan of Care (Signed)
Problem: Consults Goal: Diagnosis- Total Joint Replacement Primary Total Hip     

## 2013-10-03 NOTE — Op Note (Signed)
NAME:  Kendra Snyder [facility]                ACCOUNT NO.: [id number]      MEDICAL RECORD NO.: [id number]      FACILITY:  [facility]      PHYSICIAN:  Durene Romans D  DATE OF BIRTH:  08-19-1934     DATE OF PROCEDURE:  10/03/2013                                 OPERATIVE REPORT         PREOPERATIVE DIAGNOSIS: Left  hip severe osteoarthritis.      POSTOPERATIVE DIAGNOSIS:  Left hip severe osteoarthritis.      PROCEDURE:  Left total hip replacement through an anterior approach   utilizing DePuy THR system, component size 48mm pinnacle cup, a size 32+4 neutral   Altrex liner, a size 2 Hi Tri Lock stem with a 32+1 Articuleze metal ceramic   ball.      SURGEON:  Madlyn Frankel. Charlann Boxer, M.D.      ASSISTANT:  Lanney Gins, PA-C     ANESTHESIA:  General.      SPECIMENS:  None.      COMPLICATIONS:  None.      BLOOD LOSS:  200 cc     DRAINS:  None.      INDICATION OF THE PROCEDURE:  RUWAYDA CURET is a 79 y.o. female who had   presented to office for evaluation of left hip pain.  Radiographs revealed   progressive degenerative changes with bone-on-bone   articulation to the  hip joint.  The patient had painful limited range of   motion significantly affecting their overall quality of life.  The patient was failing to    respond to conservative measures, and at this point was ready   to proceed with more definitive measures.  The patient has noted progressive   degenerative changes in his hip, progressive problems and dysfunction   with regarding the hip prior to surgery.  Consent was obtained for   benefit of pain relief.  Specific risk of infection, DVT, component   failure, dislocation, need for revision surgery, as well discussion of   the anterior versus posterior approach were reviewed.  Consent was   obtained for benefit of anterior pain relief through an anterior   approach.      PROCEDURE IN DETAIL:  The patient was brought to operative theater.   Once  adequate anesthesia, preoperative antibiotics, 900mg  of Cleocin administered.   The patient was positioned supine on the OSI Hanna table.  Once adequate   padding of boney process was carried out, we had predraped out the hip, and  used fluoroscopy to confirm orientation of the pelvis and position.      The left hip was then prepped and draped from proximal iliac crest to   mid thigh with shower curtain technique.      Time-out was performed identifying the patient, planned procedure, and   extremity.     An incision was then made 2 cm distal and lateral to the   anterior superior iliac spine extending over the orientation of the   tensor fascia lata muscle and sharp dissection was carried down to the   fascia of the muscle and protractor placed in the soft tissues.      The fascia was then incised.  The muscle belly was  identified and swept   laterally and retractor placed along the superior neck.  Following   cauterization of the circumflex vessels and removing some pericapsular   fat, a second cobra retractor was placed on the inferior neck.  A third   retractor was placed on the anterior acetabulum after elevating the   anterior rectus.  A L-capsulotomy was along the line of the   superior neck to the trochanteric fossa, then extended proximally and   distally.  Tag sutures were placed and the retractors were then placed   intracapsular.  We then identified the trochanteric fossa and   orientation of my neck cut, confirmed this radiographically   and then made a neck osteotomy with the femur on traction.  The femoral   head was removed without difficulty or complication.  Traction was let   off and retractors were placed posterior and anterior around the   acetabulum.      The labrum and foveal tissue were debrided.  I began reaming with a 45mm   reamer and reamed up to 47mm reamer with good bony bed preparation and a 48mm   cup was chosen.  The final 48mm Pinnacle cup was then  impacted under fluoroscopy  to confirm the depth of penetration and orientation with respect to   abduction.  A screw was placed followed by the hole eliminator.  The final   32+4 neutral Altrex liner was impacted with good visualized rim fit.  The cup was positioned anatomically within the acetabular portion of the pelvis.      At this point, the femur was rolled at 80 degrees.  Further capsule was   released off the inferior aspect of the femoral neck.  I then   released the superior capsule proximally.  The hook was placed laterally   along the femur and elevated manually and held in position with the bed   hook.  The leg was then extended and adducted with the leg rolled to 100   degrees of external rotation.  Once the proximal femur was fully   exposed, I used a box osteotome to set orientation.  I then began   broaching with the starting chili pepper broach and passed this by hand and then broached up to 2.  With the 2 broach in place I chose a high offset neck and did a trial reduction.  The offset was appropriate, leg lengths   appeared to be equal, confirmed radiographically.   Given these findings, I went ahead and dislocated the hip, repositioned all   retractors and positioned the right hip in the extended and abducted position.  The final 2 Hi Tri Lock stem was   chosen and it was impacted down to the level of neck cut.  Based on this   and the trial reduction, a 32+1 Articuleze metal ball was chosen and   impacted onto a clean and dry trunnion, and the hip was reduced.  The   hip had been irrigated throughout the case again at this point.  The fascia of the   tensor fascia lata muscle was then reapproximated using #1 Vicryl and #0 V-lock sutures.  The   remaining wound was closed with 2-0 Vicryl and running 4-0 Monocryl.   The hip was cleaned, dried, and dressed sterilely using Dermabond and   Aquacel dressing.  She was then brought   to recovery room in stable condition  tolerating the procedure well.    Lanney Gins, PA-C was  present for the entirety of the case involved from   preoperative positioning, perioperative retractor management, general   facilitation of the case, as well as primary wound closure as assistant.            Madlyn FrankelMatthew D. Charlann Boxerlin, M.D.        10/03/2013 11:59 AM

## 2013-10-03 NOTE — Progress Notes (Signed)
Patient ID: Kendra Snyder, female   DOB: 07/21/1935, 79 y.o.   MRN: [id number]  Severe left hip OA with associated fall risk and multiple falls related to hip deformity and pain  As per discussed plan to OR today for left total hip replacement NPO Off lovenox Consent signed as we reviewed indications and risks as she has had her right hip replaced in the past

## 2013-10-04 LAB — CBC
HEMATOCRIT: 31.4 % — AB (ref 36.0–46.0)
HEMOGLOBIN: 10.8 g/dL — AB (ref 12.0–15.0)
MCH: 31.3 pg (ref 26.0–34.0)
MCHC: 34.4 g/dL (ref 30.0–36.0)
MCV: 91 fL (ref 78.0–100.0)
Platelets: 278 10*3/uL (ref 150–400)
RBC: 3.45 MIL/uL — ABNORMAL LOW (ref 3.87–5.11)
RDW: 13.7 % (ref 11.5–15.5)
WBC: 8.3 10*3/uL (ref 4.0–10.5)

## 2013-10-04 LAB — BASIC METABOLIC PANEL
BUN: 17 mg/dL (ref 6–23)
CHLORIDE: 98 meq/L (ref 96–112)
CO2: 27 mEq/L (ref 19–32)
Calcium: 8.9 mg/dL (ref 8.4–10.5)
Creatinine, Ser: 0.55 mg/dL (ref 0.50–1.10)
GFR calc Af Amer: >90 mL/min (ref >90)
GFR calc non Af Amer: 89 mL/min — ABNORMAL LOW (ref >90)
GLUCOSE: 204 mg/dL — AB (ref 70–99)
POTASSIUM: 5.1 meq/L (ref 3.7–5.3)
Sodium: 135 mEq/L — ABNORMAL LOW (ref 137–147)

## 2013-10-04 MED ORDER — TIZANIDINE HCL 4 MG PO TABS: 4.0000 mg | ORAL_TABLET | Freq: Four times a day (QID) | ORAL | Status: DC | PRN

## 2013-10-04 MED ORDER — POLYETHYLENE GLYCOL 3350 17 G PO PACK: 17.0000 g | PACK | Freq: Every day | ORAL | Status: DC

## 2013-10-04 MED ORDER — METHOCARBAMOL 500 MG PO TABS
500.0000 mg | ORAL_TABLET | Freq: Four times a day (QID) | ORAL | Status: DC | PRN
  Administered 2013-10-04 – 2013-10-06 (×4): 500 mg via ORAL
  Filled 2013-10-04 (×5): qty 1

## 2013-10-04 MED ORDER — FERROUS SULFATE 325 (65 FE) MG PO TABS: 325.0000 mg | ORAL_TABLET | Freq: Three times a day (TID) | ORAL | Status: AC

## 2013-10-04 MED ORDER — DSS 100 MG PO CAPS: 100.0000 mg | ORAL_CAPSULE | Freq: Every day | ORAL | Status: DC

## 2013-10-04 NOTE — Progress Notes (Addendum)
CSW assisting with d/c planning. ST Rehab is needed following hospital d/c. Pt has [employer] medicare which has limited in network facilities. [facility] SNF will request authorization from insurance once PT sees pt. CSW will continue to follow to assist with d/c planning.  Cori RazorJamie Anarely Nicholls LCSW [phone]

## 2013-10-04 NOTE — Progress Notes (Signed)
UR completed. Chevon Laufer RN CCM Case Mgmt phone [phone] 

## 2013-10-04 NOTE — Progress Notes (Signed)
[facility]. Still no authorization from insurance. Will continue to follow.  Cori RazorJamie Dejay Kronk LCSW [phone]

## 2013-10-04 NOTE — Progress Notes (Signed)
Physical Therapy Treatment Patient Details Name: Kendra ApplebaumJulia M Base MRN: [id number] DOB: 10/10/1934 Today's Date: 10/04/2013 Time: 4098-11910946-1001 PT Time Calculation (min): 15 min  PT Assessment / Plan / Recommendation  History of Present Illness Pt presented to ED after a fall.  3/5 s/p  L DA THA.     PT Comments     Follow Up Recommendations  SNF     Does the patient have the potential to tolerate intense rehabilitation     Barriers to Discharge Decreased caregiver support      Equipment Recommendations  None recommended by PT    Recommendations for Other Services OT consult  Frequency Min 6X/week   Progress towards PT Goals Progress towards PT goals: Progressing toward goals  Plan Current plan remains appropriate    Precautions / Restrictions Precautions Precautions: Fall Precaution Comments: incontinence- but better. use pads and brief Restrictions Weight Bearing Restrictions: No Other Position/Activity Restrictions: WBAT   Pertinent Vitals/Pain 3/10 at rest; 8/10 with movement; premed    Mobility  Bed Mobility Overal bed mobility: Needs Assistance Bed Mobility: Supine to Sit Supine to sit: Max assist;+2 for physical assistance Sit to supine: +2 for physical assistance;Max assist General bed mobility comments: Cues for sequence and use of R LE to self assist  Pt assisted supine to sit and to EOB utilizing drop pad on bed Transfers Overall transfer level: Needs assistance Equipment used: Rolling walker (2 wheeled) Transfers: Sit to/from Stand Sit to Stand: +2 physical assistance;Mod assist General transfer comment: increased time, cues for UE/LE position Ambulation/Gait Ambulation/Gait assistance: +2 physical assistance;Mod assist Ambulation Distance (Feet): 8 Feet Assistive device: Rolling walker (2 wheeled) Gait Pattern/deviations: Step-to pattern;Decreased step length - right;Decreased step length - left;Shuffle;Antalgic;Trunk flexed Gait velocity:  decreased General Gait Details: cues for sequence, posture, stride length     Exercises Total Joint Exercises Ankle Circles/Pumps: 15 reps;AAROM;AROM;Supine;Both   PT Diagnosis: Difficulty walking;Acute pain  PT Problem List: Decreased strength;Decreased activity tolerance;Decreased range of motion;Decreased mobility;Decreased knowledge of use of DME;Pain;Decreased knowledge of precautions;Decreased safety awareness PT Treatment Interventions: DME instruction;Gait training;Functional mobility training;Therapeutic activities;Therapeutic exercise;Patient/family education   PT Goals (current goals can now be found in the care plan section) Acute Rehab PT Goals Patient Stated Goal: get back to being independent PT Goal Formulation: With patient Time For Goal Achievement: 10/12/13 Potential to Achieve Goals: Fair  Visit Information  Last PT Received On: 10/04/13 Assistance Needed: +2 History of Present Illness: Pt presented to ED after a fall.  3/5 s/p  L DA THA.      Subjective Data  Subjective: The pain meds helped a little Patient Stated Goal: get back to being independent   Cognition  Cognition Arousal/Alertness: Awake/alert Behavior During Therapy: WFL for tasks assessed/performed Overall Cognitive Status: Within Functional Limits for tasks assessed Area of Impairment: Awareness;Problem solving Awareness: Anticipatory Problem Solving: Slow processing General Comments: Step by step cues for task completion    Balance     End of Session PT - End of Session Equipment Utilized During Treatment: Gait belt Activity Tolerance: Patient tolerated treatment well;Patient limited by pain Patient left: in chair;with call bell/phone within reach Nurse Communication: Mobility status   GP     Marilynne Dupuis 10/04/2013, 1:04 PM

## 2013-10-04 NOTE — Progress Notes (Signed)
Inpatient Diabetes Program Recommendations  AACE/ADA: New Consensus Statement on Inpatient Glycemic Control (2013)  Target Ranges:  Prepandial:   less than 140 mg/dL      Peak postprandial:   less than 180 mg/dL (1-2 hours)      Critically ill patients:  140 - 180 mg/dL   Results for Kendra Snyder, Kendra Snyder (MRN [id number]) as of 10/04/2013 11:42  Ref. Range 09/30/2013 03:50 10/04/2013 04:03  Glucose Latest Range: 70-99 mg/dL 409103 (H) 811204 (H)   Diabetes history: NO Outpatient Diabetes medications: NA Current orders for Inpatient glycemic control: None  Inpatient Diabetes Program Recommendations Correction (SSI): Noted fasting lab glucose of 204 mg/dl this morning at 9:144:03.  Patient received Dexamethasone 10mg  on 3/5 at 11:45 which is likely cause of elevated glucose. May want to consider ordering CBGs with Novolog sensitive correction scale ACHS while inpatient if appropriate.  Thanks, Orlando PennerMarie Aunika Kirsten, RN, MSN, CCRN Diabetes Coordinator Inpatient Diabetes Program [phone] (Team Pager) [phone] (AP office) [phone] [facility] office)

## 2013-10-04 NOTE — Evaluation (Signed)
Physical Therapy Re-Evaluation Patient Details Name: Kendra Snyder MRN: [id number] DOB: 04/19/1935 Today's Date: 10/04/2013 Time: [id number] PT Time Calculation (min): 15 min  PT Assessment / Plan / Recommendation History of Present Illness  Pt presented to ED after a fall.  3/5 s/p  L DA THA.    Clinical Impression  Pt s/p L THR 10/03/13 presents with decreased L LE strength/ROM and post op pain limiting functional mobility.  Pt would benefit from follow up rehab at SNF level to maximize IND and safety prior to return home with ltd assist.    PT Assessment  Patient needs continued PT services    Follow Up Recommendations  SNF    Does the patient have the potential to tolerate intense rehabilitation      Barriers to Discharge Decreased caregiver support      Equipment Recommendations  None recommended by PT    Recommendations for Other Services OT consult   Frequency Min 6X/week    Precautions / Restrictions Precautions Precautions: Fall Precaution Comments: incontinence- but better. use pads and brief Restrictions Weight Bearing Restrictions: No Other Position/Activity Restrictions: WBAT   Pertinent Vitals/Pain 8/10 with attempts to move; premedicated; IV pain meds requested      Mobility  Bed Mobility Overal bed mobility:  (Deferred 2* pain - additional pain Meds requested) Sit to supine: +2 for physical assistance;Max assist Transfers Sit to Stand: +2 physical assistance;Mod assist General transfer comment: increased time, cues for UE/LE position    Exercises Total Joint Exercises Ankle Circles/Pumps: 15 reps;AAROM;AROM;Supine;Both   PT Diagnosis: Difficulty walking;Acute pain  PT Problem List: Decreased strength;Decreased activity tolerance;Decreased range of motion;Decreased mobility;Decreased knowledge of use of DME;Pain;Decreased knowledge of precautions;Decreased safety awareness PT Treatment Interventions: DME instruction;Gait training;Functional  mobility training;Therapeutic activities;Therapeutic exercise;Patient/family education     PT Goals(Current goals can be found in the care plan section) Acute Rehab PT Goals Patient Stated Goal: get back to being independent PT Goal Formulation: With patient Time For Goal Achievement: 10/12/13 Potential to Achieve Goals: Fair  Visit Information  Last PT Received On: 10/04/13 Assistance Needed: +2 History of Present Illness: Pt presented to ED after a fall.  3/5 s/p  L DA THA.         Prior Functioning  Home Living Family/patient expects to be discharged to:: Skilled nursing facility Additional Comments: Pt prior to hospitalization drove a car and did all household management. Pt would drive her car to grocery store. She would pull rollator from the back seat and walk into the store. Pt transfered onto electric scooter to shop. Pt would drive to doctor apointments use rollator to ambulate into the building and have staff w/c her to desk. Pt only used rollator as w/c inside the home. pt reports severe pain on left LE at baseline. Prior Function Level of Independence: Needs assistance Gait / Transfers Assistance Needed: PTA pt ambulated with rollator in community setting . pt only used rollator as w/c in the home setting due to pain.  ADL's / Homemaking Assistance Needed: pt self reports difficulty and poor management. Pt states "ive been living worse than a rat" Pt very tearful and expressed concern with home life. Pt wearing diapers but to save money does not wear pants at home. instead pt stands and attempts to void into bottles. Pt was not using a urinal so catching urinal was not always successful. Pt incr risk of fall due to wet floor with incontinence  Communication Communication: No difficulties Dominant Hand: Right  Cognition  Cognition Arousal/Alertness: Awake/alert Behavior During Therapy: WFL for tasks assessed/performed Overall Cognitive Status: Within Functional Limits  for tasks assessed Area of Impairment: Awareness;Problem solving Awareness: Anticipatory Problem Solving: Slow processing General Comments: Step by step cues for task completion    Extremity/Trunk Assessment Upper Extremity Assessment Upper Extremity Assessment: Defer to OT evaluation;LUE deficits/detail LUE Deficits / Details: Pt with krepitation.  Able to lift arm up to walker and over to wash under R arm Lower Extremity Assessment Lower Extremity Assessment: LLE deficits/detail LLE: Unable to fully assess due to pain (Pt tensed up and screams with any attempts to mobilize L hip) Cervical / Trunk Assessment Cervical / Trunk Assessment: Kyphotic   Balance    End of Session PT - End of Session Equipment Utilized During Treatment: Gait belt Activity Tolerance: Patient limited by fatigue;Patient limited by pain Patient left: in bed;with call bell/phone within reach Nurse Communication: Mobility status;Patient requests pain meds  GP     Kendra Snyder 10/04/2013, 12:56 PM

## 2013-10-04 NOTE — Progress Notes (Signed)
   Subjective: 1 Day Post-Op Procedure(s) (LRB): TOTAL LEFT HIP ARTHROPLASTY ANTERIOR APPROACH (Left)   Patient reports pain as mild, pain controlled. States that she has cramping from time to time in the left hip. No other events throughout the night.   Objective:   VITALS:   Filed Vitals:   10/04/13 0626  BP: 116/61  Pulse: 90  Temp: 98.3 F (36.8 C)  Resp: 16    Neurovascular intact Dorsiflexion/Plantar flexion intact Incision: dressing C/D/I No cellulitis present Compartment soft  LABS  Recent Labs  10/04/13 0403  HGB 10.8*  HCT 31.4*  WBC 8.3  PLT 278     Recent Labs  10/04/13 0403  NA 135*  K 5.1  BUN 17  CREATININE 0.55  GLUCOSE 204*     Assessment/Plan: 1 Day Post-Op Procedure(s) (LRB): TOTAL LEFT HIP ARTHROPLASTY ANTERIOR APPROACH (Left) Advance diet Up with therapy Discharge to SNF, eventually when ready   Ortho recommendations: ASA 325 mg bid for 4 weeks for anticoagulation, unless other medically indicated. Percocet for pain management (Rx written). Zanaflex for muscle spasms (Rx written). MiraLax and Colace for constipation Iron 325 mg tid for 2-3 weeks  Dressing to remain in place until follow in clinic in 2 weeks. Dressing is waterproof and may shower with it in place. [facility] Follow up with OLIN,Soraida Vickers D in 2 weeks.  Contact information:  [facility] [address], [address] [place] [id number] [phone]      Anastasio AuerbachMatthew S. Mozetta Murfin   PAC  10/04/2013, 7:10 AM

## 2013-10-04 NOTE — Evaluation (Signed)
Occupational Therapy Re-Evaluation Patient Details Name: Kendra Snyder MRN: [id number] DOB: Jul 02, 1935 Today's Date: 10/04/2013 Time: 4098-1191 OT Time Calculation (min): 23 min  OT Assessment / Plan / Recommendation History of present illness Pt presented to ED after a fall.  3/5 s/p  L DA THA.     Clinical Impression   Pt was reassessed after the above surgery.  She will benefit from continued OT to increase safety and independence with adls.  She currently needs A x 2 and goals are set for mod A overall.    OT Assessment  Patient needs continued OT Services    Follow Up Recommendations  SNF    Barriers to Discharge      Equipment Recommendations  3 in 1 bedside comode    Recommendations for Other Services    Frequency  Min 2X/week    Precautions / Restrictions Precautions Precautions: Fall Restrictions Weight Bearing Restrictions: No   Pertinent Vitals/Pain Pain not rated.  Pt painful with movement and when washing L backside. repositioned    ADL  Grooming: Set up Where Assessed - Grooming: Unsupported sitting Upper Body Bathing: Set up Where Assessed - Upper Body Bathing: Supported sitting Lower Body Bathing: +2 Total assistance Lower Body Bathing: Patient Percentage: 10% Where Assessed - Lower Body Bathing: Supported sit to stand Upper Body Dressing: Minimal assistance Where Assessed - Upper Body Dressing: Unsupported sitting Lower Body Dressing: +2 Total assistance Lower Body Dressing: Patient Percentage: 0% Toilet Transfer: Simulated;+2 Total assistance;Moderate assistance Toilet Transfer Method: Stand pivot Toileting - Clothing Manipulation and Hygiene: +2 Total assistance Toileting - Clothing Manipulation and Hygiene: Patient Percentage: 0% Where Assessed - Toileting Clothing Manipulation and Hygiene: Sit to stand from 3-in-1 or toilet Equipment Used: Rolling walker Transfers/Ambulation Related to ADLs: spt from chair back to bed ADL Comments: assisted  with peri hygiene for bathing.  Educated on AE for adls, but did not practice with this    OT Diagnosis: Generalized weakness  OT Problem List: Decreased strength;Decreased range of motion;Decreased activity tolerance;Impaired balance (sitting and/or standing);Decreased safety awareness;Decreased knowledge of use of DME or AE;Decreased knowledge of precautions;Pain OT Treatment Interventions: Self-care/ADL training;Therapeutic exercise;DME and/or AE instruction;Therapeutic activities;Patient/family education;Balance training   OT Goals(Current goals can be found in the care plan section) Acute Rehab OT Goals Patient Stated Goal: get back to being independent OT Goal Formulation: With patient Time For Goal Achievement: 10/18/13 Potential to Achieve Goals: Good ADL Goals Pt Will Perform Grooming: with min assist;standing Pt Will Perform Lower Body Bathing: with mod assist;with adaptive equipment;sit to/from stand Pt Will Perform Lower Body Dressing: with mod assist;with adaptive equipment;sit to/from stand (pants only) Pt Will Transfer to Toilet: with min assist;stand pivot transfer;bedside commode Pt Will Perform Toileting - Clothing Manipulation and hygiene: with mod assist;sit to/from stand  Visit Information  Last OT Received On: 10/04/13 Assistance Needed: +2 History of Present Illness: Pt presented to ED after a fall.  3/5 s/p  L DA THA.         Prior Functioning     Home Living Family/patient expects to be discharged to:: Skilled nursing facility Additional Comments: Pt prior to hospitalization drove a car and did all household management. Pt would drive her car to grocery store. She would pull rollator from the back seat and walk into the store. Pt transfered onto electric scooter to shop. Pt would drive to doctor apointments use rollator to ambulate into the building and have staff w/c her to desk. Pt only used rollator as  w/c inside the home. pt reports severe pain on left LE  at baseline. Prior Function Level of Independence: Needs assistance Communication Communication: No difficulties         Vision/Perception     Cognition  Cognition Arousal/Alertness: Awake/alert Behavior During Therapy: WFL for tasks assessed/performed Overall Cognitive Status: Within Functional Limits for tasks assessed    Extremity/Trunk Assessment Upper Extremity Assessment LUE Deficits / Details: Pt with krepitation.  Able to lift arm up to walker and over to wash under R arm     Mobility Bed Mobility Sit to supine: +2 for physical assistance;Max assist Transfers Sit to Stand: +2 physical assistance;Mod assist General transfer comment: increased time, cues for UE/LE position     Exercise     Balance     End of Session OT - End of Session Activity Tolerance: Patient tolerated treatment well Patient left: in bed;with call bell/phone within reach;with bed alarm set  GO     Jalie Eiland 10/04/2013, 12:31 PM Marica OtterMaryellen Gavinn Collard, OTR/L [phone] 10/04/2013

## 2013-10-04 NOTE — Progress Notes (Signed)
[facility] PROGRESS NOTE  Kendra PouchJulia M [facility] ZOX:[id number] DOB: 04/14/1935 DOA: 09/28/2013 PCP: Sonda PrimesAlex Plotnikov, MD  Brief narrative: 79 y.o. female with PMH of remote right hip replacement complicated by osteomyelitis who [facility] status post fall. Left hip, left humerus films were without evidence of acute fracture. CT scan of the head was unremarkable. Pt is now s/p total left hip replacement.   Assessment/Plan:   Principal Problem:  Fall with left hip and knee pain (in the setting of osteopenia and osteoarthritis), generalized weakness and fatigue  - Initial radiographs were negative for fracture. CT scan of the left hip negative for subtle fracture.  - underwent total left hip replacement 10/03/2013, no subsequent complications  - pt will be discharged to SNF once bed available Active Problems:  Black stools  - FOBT negative  - no reports of blood in stool or black stool  - continue Lovenox for DVT prophylaxis  FOOT PAIN  - Continue Toradol and percocet as needed.  UTI (lower urinary tract infection)  - UTI evidence on urinalysis but urine culture showed no growth. Pt has received 5 days of Cipro (stopped 3/4/015)  Bilateral leg edema  - 2-D echocardiogram done 09/29/13, EF 65-70%, no diastolic dysfunction. ProBNP WNL. Patient has only mild hypoalbuminemia. We stopped lasix 3/4 due to low diastolic BP on 3/4; since BP is still slightly on soft side will hold lasix for now  Hypokalemia  - repleted and now WNL.  DVT Prophylaxis  - Continue Lovenox.   Code Status: Full.  Family Communication: Jari FavreSherry Sexton [phone], updated by telephone.  Disposition Plan: to SNF once stable  IV access:  Peripheral IV. Medical Consultants:  Dr. Charlann Boxerlin, Orthopedics. Other Consultants:  Physical therapy  Occupational therapy. Anti-infectives:  Cipro 09/28/13---> 10/02/2013  Manson PasseyEVINE, Roscoe Witts, MD  [facility] Pager [phone]  If 7PM-7AM, please contact  night-coverage [email or web address] Password [email or web address] 10/04/2013, 2:07 PM   LOS: 6 days    HPI/Subjective: Feels little better.   Objective: Filed Vitals:   10/04/13 0146 10/04/13 0626 10/04/13 0747 10/04/13 1000  BP: 105/66 116/61  112/36  Pulse: 73 90  83  Temp: 98.4 F (36.9 C) 98.3 F (36.8 C)  97.8 F (36.6 C)  TempSrc:    Oral  Resp: 14 16 16 15   Height:      Weight:      SpO2: 98% 97% 100% 98%    Intake/Output Summary (Last 24 hours) at 10/04/13 1407 Last data filed at 10/04/13 0815  Gross per 24 hour  Intake   1830 ml  Output    905 ml  Net    925 ml    Exam:   General:  Pt is alert, follows commands appropriately, not in acute distress  Cardiovascular: Regular rate and rhythm, S1/S2 appreciated   Respiratory: Clear to auscultation bilaterally, no wheezing, no crackles, no rhonchi  Abdomen: Soft, non tender, non distended, bowel sounds present, no guarding  Extremities: Pulses DP and PT palpable bilaterally  Neuro: Grossly nonfocal  Data Reviewed: Basic Metabolic Panel:  Recent Labs Lab 09/28/13 0800 09/29/13 0429 09/30/13 0350 10/04/13 0403  NA 140 141 147 135*  K 3.4* 3.5* 4.1 5.1  CL 100 102 109 98  CO2 29 28 27 27   GLUCOSE 111* 126* 103* 204*  BUN 19 12 13 17   CREATININE 0.56 0.50 0.51 0.55  CALCIUM 9.6 8.6 9.0 8.9   Liver Function Tests:  Recent Labs Lab 09/28/13 0800  AST 22  ALT 17  ALKPHOS 115  BILITOT 0.4  PROT 6.4  ALBUMIN 3.4*   No results found for this basename: LIPASE, AMYLASE,  in the last 168 hours No results found for this basename: AMMONIA,  in the last 168 hours CBC:  Recent Labs Lab 09/28/13 0800 09/29/13 0429 10/01/13 0413 10/04/13 0403  WBC 7.6 5.9 6.4 8.3  NEUTROABS 5.7  --   --   --   HGB 12.9 11.8* 12.7 10.8*  HCT 37.7 34.8* 37.5 31.4*  MCV 90.2 91.6 91.2 91.0  PLT 277 251 292 278   Cardiac Enzymes:  Recent Labs Lab 09/28/13 0800  CKTOTAL 162  TROPONINI <0.30   BNP: No components found with  this basename: POCBNP,  CBG: No results found for this basename: GLUCAP,  in the last 168 hours  Recent Results (from the past 240 hour(s))  URINE CULTURE     Status: None   Collection Time    09/30/13 11:49 AM      Result Value Ref Range Status   Specimen Description URINE, RANDOM   Final   Special Requests NONE   Final   Culture  Setup Time     Final   Value: 09/30/2013 18:12     Performed at [employer] [facility] Count     Final   Value: NO GROWTH     Performed at [facility]   Culture     Final   Value: NO GROWTH     Performed at [facility]   Report Status 10/01/2013 FINAL   Final  SURGICAL PCR SCREEN     Status: None   Collection Time    10/03/13  5:50 AM      Result Value Ref Range Status   MRSA, PCR NEGATIVE  NEGATIVE Final   Staphylococcus aureus NEGATIVE  NEGATIVE Final   Comment:            The Xpert SA Assay (FDA     approved for NASAL specimens     in patients over 34 years of age),     is one component of     a comprehensive surveillance     program.  Test performance has     been validated by [employer] for patients greater     than or equal to 73 year old.     It is not intended     to diagnose infection nor to     guide or monitor treatment.     Studies: Dg Pelvis Portable  10/03/2013   CLINICAL DATA:  Left hip replacement  EXAM: PORTABLE PELVIS 1-2 VIEWS  COMPARISON:  02/13/2009  FINDINGS: Left hip replacement in satisfactory position and alignment. No fracture or complication.  Right hip replacement previously performed appears satisfactory.  IMPRESSION: Satisfactory left hip replacement.   Electronically Signed   By: Marlan Palau M.D.   On: 10/03/2013 14:06   Dg Hip Portable 1 View Left  10/03/2013   CLINICAL DATA:  Left anterior hip replacement  EXAM: PORTABLE LEFT HIP - 1 VIEW  COMPARISON:  CT 09/30/2013  FINDINGS: Cross-table lateral view. Left hip replacement in satisfactory position and alignment. No fracture or  complication.  IMPRESSION: Satisfactory left hip replacement, lateral view   Electronically Signed   By: Marlan Palau M.D.   On: 10/03/2013 14:03   Dg C-arm 1-60 Min-no Report  10/03/2013   CLINICAL DATA: intraop   C-ARM 1-60 MINUTES  Fluoroscopy was utilized by the requesting physician.  No radiographic  interpretation.     Scheduled Meds: . aspirin EC  325 mg Oral BID  . docusate sodium  100 mg Oral Daily  . ferrous sulfate  325 mg Oral TID PC  . lactated ringers   Intravenous Once  . polyethylene glycol  17 g Oral Daily   Continuous Infusions:

## 2013-10-04 NOTE — Progress Notes (Signed)
Physical Therapy Treatment Patient Details Name: Kendra ApplebaumJulia M Snyder MRN: [id number] DOB: 11/20/1934 Today's Date: 10/04/2013 Time: [id number] PT Time Calculation (min): 25 min  PT Assessment / Plan / Recommendation  History of Present Illness Pt presented to ED after a fall.  3/5 s/p  L DA THA.     PT Comments   Pt continues cooperative and motivated to progress but limited by pain and requiring increased time and step by step cues for task completion.  Follow Up Recommendations  SNF     Does the patient have the potential to tolerate intense rehabilitation     Barriers to Discharge Decreased caregiver support      Equipment Recommendations  None recommended by PT    Recommendations for Other Services OT consult  Frequency Min 6X/week   Progress towards PT Goals Progress towards PT goals: Progressing toward goals  Plan Current plan remains appropriate    Precautions / Restrictions Precautions Precautions: Fall Precaution Comments: incontinence- but better. use pads and brief Restrictions Weight Bearing Restrictions: No Other Position/Activity Restrictions: WBAT   Pertinent Vitals/Pain 3/10 at rest; 8/10 with activity   Mobility  Bed Mobility Overal bed mobility: Needs Assistance Bed Mobility: Sit to Supine Supine to sit: Max assist;+2 for physical assistance Sit to supine: +2 for physical assistance;Max assist General bed mobility comments: Cues for sequence and use of R LE to self assist  Pt assisted supine to sit and to EOB utilizing drop pad on bed Transfers Overall transfer level: Needs assistance Equipment used: Rolling walker (2 wheeled) Transfers: Sit to/from Stand Sit to Stand: +2 physical assistance;Mod assist General transfer comment: increased time, cues for UE/LE position Ambulation/Gait Ambulation/Gait assistance: +2 physical assistance;Mod assist Ambulation Distance (Feet): 5 Feet Assistive device: Rolling walker (2 wheeled) Gait Pattern/deviations:  Step-to pattern;Decreased step length - right;Decreased step length - left;Shuffle;Antalgic;Trunk flexed Gait velocity: decreased General Gait Details: step by step cues for sequence, posture, stride length     Exercises Total Joint Exercises Ankle Circles/Pumps: 15 reps;AAROM;AROM;Supine;Both   PT Diagnosis: Difficulty walking;Acute pain  PT Problem List: Decreased strength;Decreased activity tolerance;Decreased range of motion;Decreased mobility;Decreased knowledge of use of DME;Pain;Decreased knowledge of precautions;Decreased safety awareness PT Treatment Interventions: DME instruction;Gait training;Functional mobility training;Therapeutic activities;Therapeutic exercise;Patient/family education   PT Goals (current goals can now be found in the care plan section) Acute Rehab PT Goals Patient Stated Goal: get back to being independent PT Goal Formulation: With patient Time For Goal Achievement: 10/12/13 Potential to Achieve Goals: Fair  Visit Information  Last PT Received On: 10/04/13 Assistance Needed: +2 PT/OT/SLP Co-Evaluation/Treatment: Yes Reason for Co-Treatment: For patient/therapist safety PT goals addressed during session: Mobility/safety with mobility OT goals addressed during session: ADL's and self-care History of Present Illness: Pt presented to ED after a fall.  3/5 s/p  L DA THA.      Subjective Data  Subjective: The pain meds helped a little Patient Stated Goal: get back to being independent   Cognition  Cognition Arousal/Alertness: Awake/alert Behavior During Therapy: WFL for tasks assessed/performed Overall Cognitive Status: Within Functional Limits for tasks assessed Area of Impairment: Awareness;Problem solving Safety/Judgement: Decreased awareness of deficits Awareness: Anticipatory Problem Solving: Slow processing General Comments: Step by step cues for task completion    Balance     End of Session PT - End of Session Equipment Utilized During  Treatment: Gait belt Activity Tolerance: Patient tolerated treatment well;Patient limited by pain Patient left: in bed;with call bell/phone within reach Nurse Communication: Mobility status   GP  Kendra Snyder 10/04/2013, 1:14 PM

## 2013-10-05 LAB — CBC
HEMATOCRIT: 32.9 % — AB (ref 36.0–46.0)
HEMOGLOBIN: 10.9 g/dL — AB (ref 12.0–15.0)
MCH: 30.4 pg (ref 26.0–34.0)
MCHC: 33.1 g/dL (ref 30.0–36.0)
MCV: 91.9 fL (ref 78.0–100.0)
Platelets: 279 10*3/uL (ref 150–400)
RBC: 3.58 MIL/uL — ABNORMAL LOW (ref 3.87–5.11)
RDW: 14 % (ref 11.5–15.5)
WBC: 10.5 10*3/uL (ref 4.0–10.5)

## 2013-10-05 LAB — BASIC METABOLIC PANEL
BUN: 15 mg/dL (ref 6–23)
CALCIUM: 8.9 mg/dL (ref 8.4–10.5)
CO2: 28 mEq/L (ref 19–32)
Chloride: 100 mEq/L (ref 96–112)
Creatinine, Ser: 0.47 mg/dL — ABNORMAL LOW (ref 0.50–1.10)
GFR calc Af Amer: >90 mL/min (ref >90)
Glucose, Bld: 128 mg/dL — ABNORMAL HIGH (ref 70–99)
POTASSIUM: 4.3 meq/L (ref 3.7–5.3)
Sodium: 137 mEq/L (ref 137–147)

## 2013-10-05 MED ORDER — HALOPERIDOL 0.5 MG PO TABS
0.5000 mg | ORAL_TABLET | Freq: Once | ORAL | Status: DC
  Filled 2013-10-05: qty 1

## 2013-10-05 MED ORDER — FUROSEMIDE 20 MG PO TABS
20.0000 mg | ORAL_TABLET | Freq: Every day | ORAL | Status: DC
  Administered 2013-10-05 – 2013-10-06 (×2): 20 mg via ORAL
  Filled 2013-10-05 (×2): qty 1

## 2013-10-05 NOTE — Progress Notes (Signed)
[facility] PROGRESS NOTE  Kendra Snyder [id number] DOB: 12/19/1934 DOA: 09/28/2013 PCP: Sonda PrimesAlex Plotnikov, MD  Brief narrative: 79 y.o. female with PMH of remote right hip replacement complicated by osteomyelitis who [facility] status post fall. Left hip, left humerus films were without evidence of acute fracture. CT scan of the head was unremarkable. Pt is now s/p total left hip replacement. She was supposed to be discharged today and was agitated, screaming in pain so we held off on discharge until next 24 hours to make sure she is stable.  Assessment/Plan:   Principal Problem:  Fall with left hip and knee pain (in the setting of osteopenia and osteoarthritis), generalized weakness and fatigue  - Initial radiographs were negative for fracture. CT scan of the left hip negative for subtle fracture.  - underwent total left hip replacement 10/03/2013, no subsequent complications  - pt will be discharged to SNF once stable  Active Problems:  Black stools  - FOBT negative  - no reports of blood in stool or black stool  - continue Lovenox for DVT prophylaxis  FOOT PAIN  - Continue Toradol and percocet as needed.  UTI (lower urinary tract infection)  - UTI evidence on urinalysis but urine culture showed no growth. Pt has received 5 days of Cipro (stopped 3/4/015)  Bilateral leg edema  - 2-D echocardiogram done 09/29/13, EF 65-70%, no diastolic dysfunction. ProBNP WNL. Patient has only mild hypoalbuminemia. We stopped lasix 3/4 due to low diastolic BP on 3/4; BP improving so we will restart lasix 20 mg daily Hypokalemia  - repleted and WNL  DVT Prophylaxis  - Continue Lovenox.   Code Status: Full.  Family Communication: Jari FavreSherry Sexton [phone], updated by telephone.  Disposition Plan: to SNF once stable   IV access:  Peripheral IV. Medical Consultants:  Dr. Charlann Boxerlin, Orthopedics. Other Consultants:  Physical therapy  Occupational therapy. Anti-infectives:   Cipro 09/28/13---> 10/02/2013    Manson PasseyEVINE, Laresha Bacorn, MD  [facility] Pager [phone]  If 7PM-7AM, please contact night-coverage [email or web address] Password [email or web address] 10/05/2013, 4:11 PM   LOS: 7 days    HPI/Subjective: Was agitated intermittently but did not require restraints.   Objective: Filed Vitals:   10/04/13 2123 10/05/13 0649 10/05/13 1027 10/05/13 1334  BP: 137/68 146/64 158/68 134/69  Pulse: 84 96 98 88  Temp: 98.1 F (36.7 C) 98.5 F (36.9 C) 99.5 F (37.5 C) 98.8 F (37.1 C)  TempSrc: Oral Oral Axillary Oral  Resp: 16 16 16 15   Height:      Weight:      SpO2: 99% 96% 92% 96%    Intake/Output Summary (Last 24 hours) at 10/05/13 1611 Last data filed at 10/05/13 1335  Gross per 24 hour  Intake    480 ml  Output   1175 ml  Net   -695 ml    Exam:   General:  Pt is sleeping this am  Cardiovascular: Regular rate and rhythm, S1/S2 appreciated  Respiratory: Clear to auscultation bilaterally, no wheezing, no crackles, no rhonchi  Abdomen: Soft, non tender, non distended, bowel sounds present, no guarding  Extremities: Pulses DP and PT palpable bilaterally  Neuro: Grossly nonfocal  Data Reviewed: Basic Metabolic Panel:  Recent Labs Lab 09/29/13 0429 09/30/13 0350 10/04/13 0403 10/05/13 0408  NA 141 147 135* 137  K 3.5* 4.1 5.1 4.3  CL 102 109 98 100  CO2 28 27 27 28   GLUCOSE 126* 103* 204* 128*  BUN 12 13 17  15  CREATININE 0.50 0.51 0.55 0.47*  CALCIUM 8.6 9.0 8.9 8.9   Liver Function Tests: No results found for this basename: AST, ALT, ALKPHOS, BILITOT, PROT, ALBUMIN,  in the last 168 hours No results found for this basename: LIPASE, AMYLASE,  in the last 168 hours No results found for this basename: AMMONIA,  in the last 168 hours CBC:  Recent Labs Lab 09/29/13 0429 10/01/13 0413 10/04/13 0403 10/05/13 0408  WBC 5.9 6.4 8.3 10.5  HGB 11.8* 12.7 10.8* 10.9*  HCT 34.8* 37.5 31.4* 32.9*  MCV 91.6 91.2 91.0 91.9  PLT 251 292 278 279    Cardiac Enzymes: No results found for this basename: CKTOTAL, CKMB, CKMBINDEX, TROPONINI,  in the last 168 hours BNP: No components found with this basename: POCBNP,  CBG: No results found for this basename: GLUCAP,  in the last 168 hours  URINE CULTURE     Status: None   Collection Time    09/30/13 11:49 AM      Result Value Ref Range Status   Specimen Description URINE, RANDOM   Final   Value: NO GROWTH     Performed at [facility]   Report Status 10/01/2013 FINAL   Final  SURGICAL PCR SCREEN     Status: None   Collection Time    10/03/13  5:50 AM      Result Value Ref Range Status   MRSA, PCR NEGATIVE  NEGATIVE Final   Staphylococcus aureus NEGATIVE  NEGATIVE Final     Studies: No results found.  Scheduled Meds: . aspirin EC  325 mg Oral BID  . docusate sodium  100 mg Oral Daily  . ferrous sulfate  325 mg Oral TID PC  . haloperidol  0.5 mg Oral Once  . polyethylene glycol  17 g Oral Daily

## 2013-10-05 NOTE — Progress Notes (Signed)
Per Rodney Boozeasha at [employer] [employer] auth received.  Notified MD.  Per MD, Pt likely ready for d/c today.  CSW attempted to speak with Pt.  Pt just settled down after intense pain and is resting comfortably.  CSW decided not to disturb Pt, at this time.  CSW LM for Pt's daughter updating her on Pt's possible d/c today.  Providence CrosbyAmanda Dilpreet Faires, LCSWA Clinical Social Work [phone]

## 2013-10-05 NOTE — Progress Notes (Signed)
Physical Therapy Treatment Patient Details Name: Kendra ApplebaumJulia M Snyder MRN: [id number] DOB: 07/22/1935 Today's Date: 10/05/2013 Time: [id number] PT Time Calculation (min): 36 min  PT Assessment / Plan / Recommendation  History of Present Illness Pt presented to ED after a fall.  3/5 s/p  L DA THA.     PT Comments   POD #2 VERY difficult to arouse pt enough to participate. Non verbal looking around, few words.  Required increased time to transition from supine to EOB without pt screaming as she did earlier for nursing staff. Once upright pt was able to sit EOB x 10 min at [employer] assist.  Still pt non verbal/dazzed.  Assisted to recliner "Bear Hug" 1/4 turn from bed then + 2 side by side assist to scoot back and position to comfort.    Follow Up Recommendations  SNF     Does the patient have the potential to tolerate intense rehabilitation     Barriers to Discharge        Equipment Recommendations       Recommendations for Other Services    Frequency Min 6X/week   Progress towards PT Goals Progress towards PT goals: Progressing toward goals  Plan      Precautions / Restrictions Precautions Precautions: Fall Restrictions Weight Bearing Restrictions: No Other Position/Activity Restrictions: WBAT   Pertinent Vitals/Pain Unable to rate but pt did motion/grimace pain with supine to sit and stated "muscle"    Mobility  Bed Mobility Overal bed mobility: Needs Assistance Bed Mobility: Supine to Sit Sit to supine: +2 for physical assistance General bed mobility comments: Increased time with difficulty to arrouse.  Total assist pt 5% with HOB elevated and use of pad to swival hips to EOB.  Once upright pt was able to self sit EOB x 10 min at [employer] assist.  Transfers Overall transfer level: Needs assistance Equipment used: None Transfers: Stand Pivot Transfers Sit to Stand: +2 physical assistance General transfer comment: "BearHug" stand pivot 1/4 turn from elevated bed to recliner  pt 10%.  Pt slow/groggy and difficult to orient to task at hand.  Fearfull and grasping tight during transfer.  Ambulation/Gait Gait velocity: unable to attempt due to low self performance with bed mobility and transfers.     PT Goals (current goals can now be found in the care plan section)    Visit Information  Last PT Received On: 10/05/13 Assistance Needed: +2 History of Present Illness: Pt presented to ED after a fall.  3/5 s/p  L DA THA.      Subjective Data      Cognition       Balance     End of Session PT - End of Session Activity Tolerance: Patient limited by lethargy Patient left: in chair;with call bell/phone within reach   Felecia ShellingLori Tierney Behl  [facility]  Acute  Rehab Pager      [phone]

## 2013-10-05 NOTE — Progress Notes (Signed)
Pt c/o "mouth soreness" and questioned if it could be related to meds she is taking. Encouraged Oral hygiene. Also provided salt water to rinse and spit. Will continue to monitor

## 2013-10-05 NOTE — Plan of Care (Signed)
Problem: Phase III Progression Outcomes Goal: Voiding independently Outcome: Not Met (add Reason) retention Goal: Foley discontinued Outcome: Not Met (add Reason) reinserted

## 2013-10-05 NOTE — Progress Notes (Signed)
Per RN, Pt not ready for d/c today.  Notified Debbie at [facility].  CSW to continue to follow.  Providence CrosbyAmanda Jurnie Garritano, LCSWA Clinical Social Work [phone]

## 2013-10-05 NOTE — Progress Notes (Signed)
Pt moaning and calling out for "help". Assisted onto bedpan which is obviously painful. Refused medications stating she "wanted to talk to MD".[facility] to facilitate spontaneous void. No results.  Assisted back to bed, pt unable to void. Yelling loudly when assisted to move

## 2013-10-05 NOTE — Progress Notes (Signed)
Pt catheterized when bladder scanned for >500cc. Screams when touched & continuously during entire time of I&O cath. Would not respond to name or questions, only taking time to breath between screams. Received pain meds prior to catheterization. Will page MD & ask for anxiety med. Tricia [place], [employer]

## 2013-10-05 NOTE — Progress Notes (Signed)
Pt sleeping quietly. Haldol held. MD notified. Kendra Snyder, [employer]

## 2013-10-05 NOTE — Progress Notes (Signed)
Physical Therapy Treatment Patient Details Name: Kendra Snyder MRN: [id number] DOB: 04/05/1935 Today's Date: 10/05/2013 Time: 4098-11911412-1427 PT Time Calculation (min): 15 min  PT Assessment / Plan / Recommendation  History of Present Illness Pt presented to ED after a fall.  3/5 s/p  L DA THA.     PT Comments   POD # 2 pm session.  Pt more alet and conversive.  [facility] (large BM) then was able to take a few steps to get back to bed.  Pt required increased time and cueing but was more able to self assist.  Follow Up Recommendations  SNF     Does the patient have the potential to tolerate intense rehabilitation     Barriers to Discharge        Equipment Recommendations       Recommendations for Other Services    Frequency Min 6X/week   Progress towards PT Goals Progress towards PT goals: Progressing toward goals  Plan      Precautions / Restrictions Precautions Precautions: Fall Restrictions Weight Bearing Restrictions: No Other Position/Activity Restrictions: WBAT   Pertinent Vitals/Pain "alot"    Mobility  Bed Mobility Overal bed mobility: Needs Assistance Bed Mobility: Sit to Supine Sit to supine: +2 for physical assistance;Max assist General bed mobility comments: assisted back to bed carefully due to pain.  Increased time to position to comfort.  Transfers Overall transfer level: Needs assistance Equipment used: Rolling walker (2 wheeled) Transfers: Sit to/from Stand Sit to Stand: +2 physical assistance;Mod assist;Max assist General transfer comment: Pt more alert/awake.  Was able to assist self to reise with 50% VC's on proper hand placement and increase time.  [facility] then back to bed.  Ambulation/Gait Ambulation/Gait assistance: +2 physical assistance;Mod assist Ambulation Distance (Feet):  (a few steps from [facility] to bed) Assistive device: Rolling walker (2 wheeled) Gait Pattern/deviations: Step-to pattern Gait  velocity: Pt more alert/awake was able to stand with RW and take a few steps [facility] to bed. General Gait Details: 50% VC's on turn completion and direction    PT Goals (current goals can now be found in the care plan section)    Visit Information  Last PT Received On: 10/05/13 Assistance Needed: +2 History of Present Illness: Pt presented to ED after a fall.  3/5 s/p  L DA THA.      Subjective Data      Cognition       Balance     End of Session PT - End of Session Equipment Utilized During Treatment: Gait belt Activity Tolerance: Patient tolerated treatment well Patient left: in bed;with call bell/phone within reach;with bed alarm set   Felecia ShellingLori Konnor Vondrasek  [facility]  Acute  Rehab Pager      [phone]

## 2013-10-06 MED ORDER — FUROSEMIDE 20 MG PO TABS: 20.0000 mg | ORAL_TABLET | Freq: Every day | ORAL | Status: DC

## 2013-10-06 MED ORDER — METHOCARBAMOL 500 MG PO TABS: 500.0000 mg | ORAL_TABLET | Freq: Four times a day (QID) | ORAL | Status: DC | PRN

## 2013-10-06 MED ORDER — ONDANSETRON HCL 4 MG PO TABS: 4.0000 mg | ORAL_TABLET | Freq: Four times a day (QID) | ORAL | Status: AC | PRN

## 2013-10-06 NOTE — Discharge Instructions (Signed)
Total Hip Replacement Total hip replacement is the replacement of your damaged hip with an artificial hip joint (prosthetic hip joint). The purpose of this surgery is to reduce pain and improve your hip function. LET YOUR CAREGIVER KNOW ABOUT:   Any allergies you have.  Any medicines you are taking, including vitamins, herbs, eyedrops, over-the-counter medicines, and creams.  Any problems you have had with the use of anesthetics.  Family history of problems with the use of anesthetics.  Any blood disorders you have, including bleeding problems or clotting problems.  Previous surgeries you have had. RISKS AND COMPLICATIONS Generally, total hip replacement is a safe procedure. However, as with any surgical procedure, complications can occur. Complications associated with total hip replacement both during and after the procedure include:  Infection.  Dislocation (the ball of the hip-joint prosthesis comes out of contact with the socket).  Loosening of the stem connected to the ball or socket.  Fracture of the bone while inserting the prosthesis.  Formation of blood clots, which can break loose and travel to and injure your lungs (pulmonary embolus). BEFORE THE PROCEDURE   Your caregiver will instruct you when you need to stop eating and drinking.  Ask your caregiver if you need to change or stop any regular medicines. PROCEDURE Just before the procedure, you will receive medicine that will make you drowsy (sedative) or medicine to make you fall asleep (general anesthetic). This will be given through a tube that is inserted into one of your veins (intravenous [IV] tube). Then you will receive medicine to block pain from the waist down through your legs (spinal block). An incision is made in your hip. Your surgeon will take out any damaged cartilage and bone. Next, your surgeon will insert a prosthetic socket into your pelvic bone. This is usually secured with screws. Then, your surgeon  will cut off the ball of your thigh bone (femur) and attach a prosthetic ball on a stem to your femur. The surgeon then places the ball into the socket and checks the range of motion of your new hip. AFTER THE PROCEDURE  You will be taken to the recovery area where a nurse will watch and check your progress. Once you are awake and stable, you will be taken to a hospital room. You will receive physical therapy until you are doing well and your caregiver feels it is safe for you to go home. Typically, you will stay in the hospital 1 4 days after your procedure. Document Released: 10/24/2000 Document Revised: 01/17/2012 Document Reviewed: 09/04/2011 Exit[employer] Patient Information 2014 [employer].  

## 2013-10-06 NOTE — Plan of Care (Signed)
Problem: Discharge Progression Outcomes Goal: Activity appropriate for discharge plan Outcome: Completed/Met Date Met:  10/06/13 For rehab

## 2013-10-06 NOTE — Progress Notes (Signed)
Discharged from floor via stretcher, EMTs with pt. No change in assessment. Deandra Goering, [employer]

## 2013-10-06 NOTE — Progress Notes (Signed)
Per MD, Pt ready for d/c.  Notified RN, Pt, family and facility.  Sent d/c summary.  Confirmed receipt of d/c summary.  Facility ready to receive Pt.  Arranged for transportation.  Amanda Devann Cribb, LCSWA Clinical Social Work [phone]   

## 2013-10-06 NOTE — Discharge Summary (Signed)
Physician Discharge Summary  Kendra Snyder [facility] ZOX:[id number] DOB: June 18, 1935 DOA: 09/28/2013  PCP: [name], MD  Admit date: 09/28/2013 Discharge date: 10/06/2013  Recommendations for Outpatient Follow-up:  Maintain surgical dressing for 10-14 days, or until follow up in the clinic. Follow up in 2 weeks at [facility]. Call with any questions or concerns.  Discharge instructions     Please continue pain medications as prescribed. Please observe for any mental status changes. She did have some episodes of severe pain but this is now controlled and prescription was prescribed for robaxin.  May continue low dose lasix for lower extremity edema which has improved since admission.        Patient is on low dose lasix so please recheck potassium in next 4-5 days to make sure it is stable.        Replete as needed.  TED hose     Use stockings (TED hose) for 2 weeks on both leg(s).  You may remove them at night for sleeping.     Discharge Diagnoses:  Principal Problem:   Fall Active Problems:   FOOT PAIN   UTI (lower urinary tract infection)   Bilateral leg edema   Generalized weakness   Hypokalemia   Left hip pain   Osteoarthritis of left hip   S/P left THA    Discharge Condition: medically stable for discharge to SNF today   Diet recommendation: as tolerated   History of present illness:  79 y.o. female with PMH of remote right hip replacement complicated by osteomyelitis who was admitted to [facility] ED 09/28/13 status post fall. Left hip, left humerus films were without evidence of acute fracture. CT scan of the head was unremarkable. Pt is now s/p total left hip replacement. She was supposed to be discharged 10/05/2013 but was agitated, screaming in pain so we held off on discharge until today. She is stable for D/C today.  Assessment/Plan:  Principal Problem:  Fall with left hip and knee pain (in the setting of osteopenia and osteoarthritis), generalized weakness and  fatigue  - Initial radiographs were negative for fracture. CT scan of the left hip negative for subtle fracture.  - underwent total left hip replacement 10/03/2013, no subsequent complications   Active Problems:  Black stools  - FOBT negative  - no reports of blood in stool or black stool  - continue Lovenox for DVT prophylaxis in hospital FOOT PAIN  - pain med as prescribed  UTI (lower urinary tract infection)  - UTI evidence on urinalysis but urine culture showed no growth. Pt has received 5 days of Cipro (stopped 3/4/015)  Bilateral leg edema  - 2-D echocardiogram done 09/29/13, EF 65-70%, no diastolic dysfunction. ProBNP WNL. Patient has only mild hypoalbuminemia. We stopped lasix 3/4 due to low diastolic BP on 3/4; BP improving so we restarted lasix 20 mg daily  Hypokalemia  - repleted and WNL    Code Status: Full.  Family Communication: Jari Favre [phone], updated by telephone.   IV access:  Peripheral IV. Medical Consultants:  Dr. Charlann Boxer, Orthopedics. Other Consultants:  Physical therapy  Occupational therapy. Anti-infectives:  Cipro 09/28/13---> 10/02/2013   Signed:  Manson Passey, MD  [facility] 10/06/2013, 10:06 AM  Pager #: [phone]   Discharge Exam: Filed Vitals:   10/06/13 0516  BP: 148/77  Pulse: 86  Temp: 98.2 F (36.8 C)  Resp: 18   Filed Vitals:   10/05/13 1027 10/05/13 1334 10/05/13 2102 10/06/13 0516  BP: 158/68 134/69 155/73  148/77  Pulse: 98 88 105 86  Temp: 99.5 F (37.5 C) 98.8 F (37.1 C) 98.4 F (36.9 C) 98.2 F (36.8 C)  TempSrc: Axillary Oral Oral Oral  Resp: 16 15 16 18   Height:      Weight:      SpO2: 92% 96% 97% 97%    General: Pt is alert, follows commands appropriately, not in acute distress Cardiovascular: Regular rate and rhythm, S1/S2 +, no murmurs, no rubs, no gallops Respiratory: Clear to auscultation bilaterally, no wheezing, no crackles, no rhonchi Abdominal: Soft, non tender, non distended, bowel  sounds +, no guarding Extremities: trace LE edema, no cyanosis, pulses palpable bilaterally DP and PT Neuro: Grossly nonfocal  Discharge Instructions  Discharge Orders   Future Orders Complete By Expires   Call MD / Call 911  As directed    Comments:     If you experience chest pain or shortness of breath, CALL 911 and be transported to the hospital emergency room.  If you develope a fever above 101 F, pus (white drainage) or increased drainage or redness at the wound, or calf pain, call your surgeon's office.   Call MD for:  difficulty breathing, headache or visual disturbances  As directed    Call MD for:  persistant dizziness or light-headedness  As directed    Call MD for:  persistant nausea and vomiting  As directed    Call MD for:  severe uncontrolled pain  As directed    Change dressing  As directed    Comments:     Maintain surgical dressing for 10-14 days, or until follow up in the clinic.   Constipation Prevention  As directed    Comments:     Drink plenty of fluids.  Prune juice may be helpful.  You may use a stool softener, such as Colace (over the counter) 100 mg twice a day.  Use MiraLax (over the counter) for constipation as needed.   Diet - low sodium heart healthy  As directed    Diet - low sodium heart healthy  As directed    Discharge instructions  As directed    Comments:     Maintain surgical dressing for 10-14 days, or until follow up in the clinic. Follow up in 2 weeks at [facility]. Call with any questions or concerns.   Discharge instructions  As directed    Comments:     Please continue pain medications as prescribed. Please observe for any mental status changes. She did have some episodes of severe pain but this is now controlled and prescription was prescribed for robaxin.  May continue low dose lasix for lower extremity edema which has improved since admission.   Driving restrictions  As directed    Comments:     No driving for 4 weeks    Increase activity slowly as tolerated  As directed    Increase activity slowly  As directed    TED hose  As directed    Comments:     Use stockings (TED hose) for 2 weeks on both leg(s).  You may remove them at night for sleeping.   Weight bearing as tolerated  As directed    Questions:     Laterality:  left   Extremity:  Lower       Medication List    STOP taking these medications       OVER THE COUNTER MEDICATION      TAKE these medications  aspirin EC 325 MG tablet  Take 1 tablet (325 mg total) by mouth 2 (two) times daily. Take for 4 weeks.     DSS 100 MG Caps  Take 100 mg by mouth daily.     ferrous sulfate 325 (65 FE) MG tablet  Take 1 tablet (325 mg total) by mouth 3 (three) times daily after meals.     furosemide 20 MG tablet  Commonly known as:  LASIX  Take 1 tablet (20 mg total) by mouth daily.     methocarbamol 500 MG tablet  Commonly known as:  ROBAXIN  Take 1 tablet (500 mg total) by mouth every 6 (six) hours as needed for muscle spasms.     ondansetron 4 MG tablet  Commonly known as:  ZOFRAN  Take 1 tablet (4 mg total) by mouth every 6 (six) hours as needed for nausea.     oxyCODONE-acetaminophen 5-325 MG per tablet  Commonly known as:  ROXICET  Take 1-2 tablets by mouth every 4 (four) hours as needed for severe pain.     polyethylene glycol packet  Commonly known as:  MIRALAX / GLYCOLAX  Take 17 g by mouth daily.     tiZANidine 4 MG tablet  Commonly known as:  ZANAFLEX  Take 1 tablet (4 mg total) by mouth every 6 (six) hours as needed.           Follow-up Information   Follow up with Shelda PalLIN,MATTHEW D, MD. Schedule an appointment as soon as possible for a visit in 2 weeks.   Specialty:  Orthopedic Surgery   Contact information:   [address] [place] [address] [phone]        The results of significant diagnostics from this hospitalization (including imaging, microbiology, ancillary and laboratory) are  listed below for reference.    Significant Diagnostic Studies: Dg Hip Complete Left  09/28/2013   CLINICAL DATA:  Left hip pain, fall  EXAM: LEFT HIP - COMPLETE 2+ VIEW  COMPARISON:  02/13/2009  FINDINGS: Three views of the left hip submitted. There is diffuse osteopenia. Again noted right hip prosthesis. No definite acute fracture is identified. There is significant progression of degenerative changes. Significant narrowing of superior left hip joint space. Cystic and sclerotic changes are noted superior acetabulum. Sclerotic changes are noted superior aspect of femoral head. There is collapse and remodeling with partial impaction of superior aspect of femoral head of indeterminate age. Clinical correlation is necessary, further correlation with MRI could be performed as clinically warranted.  IMPRESSION: There is diffuse osteopenia. Again noted right hip prosthesis. No definite acute fracture is identified. There is significant progression of degenerative changes. Significant narrowing of superior left hip joint space. Cystic and sclerotic changes are noted superior acetabulum. Sclerotic changes are noted superior aspect of femoral head. There is collapse and remodeling with partial impaction of superior aspect of femoral head of indeterminate age. Clinical correlation is necessary, further correlation with MRI could be performed as clinically warranted.   Electronically Signed   By: Natasha MeadLiviu  Pop M.D.   On: 09/28/2013 09:07   Ct Head Wo Contrast  09/28/2013   CLINICAL DATA:  Larey SeatFell 11 p.m. yesterday, head pain  EXAM: CT HEAD WITHOUT CONTRAST  TECHNIQUE: Contiguous axial images were obtained from the base of the skull through the vertex without intravenous contrast.  COMPARISON:  CT HEAD W/O CM dated 08/16/2010; DG CERVICAL SPINE 2-3 VIEWS dated 02/13/2009  FINDINGS: Stable mild atrophy. No evidence of hemorrhage, infarct, or mass.  No mass effect or hydrocephalus. Calvarium is intact.  IMPRESSION: No significant  change from prior study.  Age-related atrophy.   Electronically Signed   By: Esperanza Heir M.D.   On: 09/28/2013 08:18   Dg Pelvis Portable  10/03/2013   CLINICAL DATA:  Left hip replacement  EXAM: PORTABLE PELVIS 1-2 VIEWS  COMPARISON:  02/13/2009  FINDINGS: Left hip replacement in satisfactory position and alignment. No fracture or complication.  Right hip replacement previously performed appears satisfactory.  IMPRESSION: Satisfactory left hip replacement.   Electronically Signed   By: Marlan Palau M.D.   On: 10/03/2013 14:06   Ct Hip Left Wo Contrast  09/30/2013   CLINICAL DATA:  Left hip pain.  EXAM: CT OF THE LEFT HIP WITHOUT CONTRAST  TECHNIQUE: Multidetector CT imaging was performed according to the standard protocol. Multiplanar CT image reconstructions were also generated.  COMPARISON:  Radiographs 09/28/2013  FINDINGS: Severe degenerative disease involving the left hip with marked joint space narrowing, subchondral cystic change, osteophytic spurring and bony eburnation. There is a large subchondral geode involving the superior acetabulum. The femoral head is deformed and flattened without obvious AVN.  No acute fractures identified. A joint effusion is suspected. The left SI joint is intact. No obvious left-sided intrapelvic abnormality. No left inguinal mass or adenopathy.  There is subcutaneous edema involving the left hip region which could be due to trauma. No obvious muscular abnormality.  IMPRESSION: Severe progressive degenerative changes involving the left hip as described above.  No acute fracture.   Electronically Signed   By: Loralie Champagne M.D.   On: 09/30/2013 09:20   Dg Hip Portable 1 View Left  10/03/2013   CLINICAL DATA:  Left anterior hip replacement  EXAM: PORTABLE LEFT HIP - 1 VIEW  COMPARISON:  CT 09/30/2013  FINDINGS: Cross-table lateral view. Left hip replacement in satisfactory position and alignment. No fracture or complication.  IMPRESSION: Satisfactory left hip  replacement, lateral view   Electronically Signed   By: Marlan Palau M.D.   On: 10/03/2013 14:03   Dg Humerus Left  09/28/2013   CLINICAL DATA:  Pain post fall  EXAM: LEFT HUMERUS - 2+ VIEW  COMPARISON:  None.  FINDINGS: Advanced degenerative changes at the glenohumeral articulation with remodeling of the humeral head. Mild diffuse osteopenia. Negative for fracture, dislocation, or other acute bone abnormality.  IMPRESSION: 1. Negative for fracture or other acute bone abnormality. 2. Glenohumeral degenerative changes as above.   Electronically Signed   By: Oley Balm M.D.   On: 09/28/2013 11:12   Dg C-arm 1-60 Min-no Report  10/03/2013   CLINICAL DATA: intraop   C-ARM 1-60 MINUTES  Fluoroscopy was utilized by the requesting physician.  No radiographic  interpretation.     Microbiology: Recent Results (from the past 240 hour(s))  URINE CULTURE     Status: None   Collection Time    09/30/13 11:49 AM      Result Value Ref Range Status   Specimen Description URINE, RANDOM   Final   Special Requests NONE   Final   Culture  Setup Time     Final   Value: 09/30/2013 18:12     Performed at [facility] Count     Final   Value: NO GROWTH     Performed at [facility]   Culture     Final   Value: NO GROWTH     Performed at [facility]   Report  Status 10/01/2013 FINAL   Final  SURGICAL PCR SCREEN     Status: None   Collection Time    10/03/13  5:50 AM      Result Value Ref Range Status   MRSA, PCR NEGATIVE  NEGATIVE Final   Staphylococcus aureus NEGATIVE  NEGATIVE Final   Comment:            The Xpert SA Assay (FDA     approved for NASAL specimens     in patients over 34 years of age),     is one component of     a comprehensive surveillance     program.  Test performance has     been validated by [employer] for patients greater     than or equal to 45 year old.     It is not intended     to diagnose infection nor to     guide or monitor  treatment.     Labs: Basic Metabolic Panel:  Recent Labs Lab 09/30/13 0350 10/04/13 0403 10/05/13 0408  NA 147 135* 137  K 4.1 5.1 4.3  CL 109 98 100  CO2 27 27 28   GLUCOSE 103* 204* 128*  BUN 13 17 15   CREATININE 0.51 0.55 0.47*  CALCIUM 9.0 8.9 8.9   Liver Function Tests: No results found for this basename: AST, ALT, ALKPHOS, BILITOT, PROT, ALBUMIN,  in the last 168 hours No results found for this basename: LIPASE, AMYLASE,  in the last 168 hours No results found for this basename: AMMONIA,  in the last 168 hours CBC:  Recent Labs Lab 10/01/13 0413 10/04/13 0403 10/05/13 0408  WBC 6.4 8.3 10.5  HGB 12.7 10.8* 10.9*  HCT 37.5 31.4* 32.9*  MCV 91.2 91.0 91.9  PLT 292 278 279   Cardiac Enzymes: No results found for this basename: CKTOTAL, CKMB, CKMBINDEX, TROPONINI,  in the last 168 hours BNP: BNP (last 3 results)  Recent Labs  09/29/13 0424  PROBNP 308.0   CBG: No results found for this basename: GLUCAP,  in the last 168 hours  Time coordinating discharge: Over 30 minutes

## 2013-10-06 NOTE — Progress Notes (Signed)
   Subjective: 3 Days Post-Op Procedure(s) (LRB): TOTAL LEFT HIP ARTHROPLASTY ANTERIOR APPROACH (Left)  Pt c/o moderate pain to left hip this morning Pain with movement Denies any numbness or tingling distally Patient reports pain as moderate.  Objective:   VITALS:   Filed Vitals:   10/06/13 0516  BP: 148/77  Pulse: 86  Temp: 98.2 F (36.8 C)  Resp: 18    Left hip incision healing well nv intact distally No rashes or edema Pain with quad activation  LABS  Recent Labs  10/04/13 0403 10/05/13 0408  HGB 10.8* 10.9*  HCT 31.4* 32.9*  WBC 8.3 10.5  PLT 278 279     Recent Labs  10/04/13 0403 10/05/13 0408  NA 135* 137  K 5.1 4.3  BUN 17 15  CREATININE 0.55 0.47*  GLUCOSE 204* 128*     Assessment/Plan: 3 Days Post-Op Procedure(s) (LRB): TOTAL LEFT HIP ARTHROPLASTY ANTERIOR APPROACH (Left)  Continue PT/OT as tolerated Pain control D/c planning   Alphonsa OverallBrad Marlen Mollica, MPAS, PA-C  10/06/2013, 7:37 AM

## 2013-10-08 ENCOUNTER — Emergency Department (HOSPITAL_COMMUNITY): Payer: Medicare HMO

## 2013-10-08 ENCOUNTER — Encounter: Payer: Self-pay | Admitting: Internal Medicine

## 2013-10-08 ENCOUNTER — Encounter (HOSPITAL_COMMUNITY): Payer: Self-pay | Admitting: Emergency Medicine

## 2013-10-08 ENCOUNTER — Non-Acute Institutional Stay (SKILLED_NURSING_FACILITY): Payer: Medicare HMO | Admitting: Internal Medicine

## 2013-10-08 ENCOUNTER — Observation Stay (HOSPITAL_COMMUNITY): Payer: Medicare HMO

## 2013-10-08 ENCOUNTER — Inpatient Hospital Stay (HOSPITAL_COMMUNITY)
Admission: EM | Admit: 2013-10-08 | Discharge: 2013-10-10 | DRG: 065 | Disposition: A | Discharge status: Skilled Nursing Facility | Payer: Medicare HMO | Attending: Internal Medicine | Admitting: Internal Medicine

## 2013-10-08 DIAGNOSIS — R4189 Other symptoms and signs involving cognitive functions and awareness: Secondary | ICD-10-CM | POA: Diagnosis present

## 2013-10-08 DIAGNOSIS — I639 Cerebral infarction, unspecified: Secondary | ICD-10-CM

## 2013-10-08 DIAGNOSIS — E876 Hypokalemia: Secondary | ICD-10-CM | POA: Diagnosis present

## 2013-10-08 DIAGNOSIS — M1612 Unilateral primary osteoarthritis, left hip: Secondary | ICD-10-CM

## 2013-10-08 DIAGNOSIS — W19XXXA Unspecified fall, initial encounter: Secondary | ICD-10-CM

## 2013-10-08 DIAGNOSIS — R4182 Altered mental status, unspecified: Secondary | ICD-10-CM | POA: Diagnosis present

## 2013-10-08 DIAGNOSIS — R609 Edema, unspecified: Secondary | ICD-10-CM

## 2013-10-08 DIAGNOSIS — N390 Urinary tract infection, site not specified: Secondary | ICD-10-CM

## 2013-10-08 DIAGNOSIS — K921 Melena: Secondary | ICD-10-CM

## 2013-10-08 DIAGNOSIS — I471 Supraventricular tachycardia, unspecified: Secondary | ICD-10-CM

## 2013-10-08 DIAGNOSIS — R195 Other fecal abnormalities: Secondary | ICD-10-CM

## 2013-10-08 DIAGNOSIS — M79609 Pain in unspecified limb: Secondary | ICD-10-CM

## 2013-10-08 DIAGNOSIS — I5032 Chronic diastolic (congestive) heart failure: Secondary | ICD-10-CM

## 2013-10-08 DIAGNOSIS — R600 Localized edema: Secondary | ICD-10-CM

## 2013-10-08 DIAGNOSIS — I6350 Cerebral infarction due to unspecified occlusion or stenosis of unspecified cerebral artery: Principal | ICD-10-CM | POA: Diagnosis present

## 2013-10-08 DIAGNOSIS — T502X5A Adverse effect of carbonic-anhydrase inhibitors, benzothiadiazides and other diuretics, initial encounter: Secondary | ICD-10-CM | POA: Diagnosis present

## 2013-10-08 DIAGNOSIS — Z96649 Presence of unspecified artificial hip joint: Secondary | ICD-10-CM

## 2013-10-08 DIAGNOSIS — R404 Transient alteration of awareness: Secondary | ICD-10-CM

## 2013-10-08 DIAGNOSIS — Z7982 Long term (current) use of aspirin: Secondary | ICD-10-CM

## 2013-10-08 DIAGNOSIS — D62 Acute posthemorrhagic anemia: Secondary | ICD-10-CM | POA: Diagnosis present

## 2013-10-08 DIAGNOSIS — M25552 Pain in left hip: Secondary | ICD-10-CM

## 2013-10-08 DIAGNOSIS — D500 Iron deficiency anemia secondary to blood loss (chronic): Secondary | ICD-10-CM | POA: Diagnosis present

## 2013-10-08 DIAGNOSIS — Z8249 Family history of ischemic heart disease and other diseases of the circulatory system: Secondary | ICD-10-CM

## 2013-10-08 DIAGNOSIS — R5381 Other malaise: Secondary | ICD-10-CM

## 2013-10-08 DIAGNOSIS — R531 Weakness: Secondary | ICD-10-CM

## 2013-10-08 DIAGNOSIS — Z79899 Other long term (current) drug therapy: Secondary | ICD-10-CM

## 2013-10-08 DIAGNOSIS — I498 Other specified cardiac arrhythmias: Secondary | ICD-10-CM | POA: Diagnosis present

## 2013-10-08 DIAGNOSIS — R5383 Other fatigue: Secondary | ICD-10-CM

## 2013-10-08 DIAGNOSIS — Z4789 Encounter for other orthopedic aftercare: Secondary | ICD-10-CM

## 2013-10-08 LAB — DIFFERENTIAL
BASOS PCT: 1 % (ref 0–1)
Basophils Absolute: 0 10*3/uL (ref 0.0–0.1)
Eosinophils Absolute: 0 10*3/uL (ref 0.0–0.7)
Eosinophils Relative: 1 % (ref 0–5)
LYMPHS ABS: 0.8 10*3/uL (ref 0.7–4.0)
LYMPHS PCT: 14 % (ref 12–46)
MONOS PCT: 10 % (ref 3–12)
Monocytes Absolute: 0.6 10*3/uL (ref 0.1–1.0)
NEUTROS ABS: 4.1 10*3/uL (ref 1.7–7.7)
NEUTROS PCT: 75 % (ref 43–77)

## 2013-10-08 LAB — RAPID URINE DRUG SCREEN, HOSP PERFORMED
AMPHETAMINES: NOT DETECTED
BENZODIAZEPINES: NOT DETECTED
Barbiturates: NOT DETECTED
COCAINE: NOT DETECTED
Opiates: NOT DETECTED
TETRAHYDROCANNABINOL: NOT DETECTED

## 2013-10-08 LAB — URINALYSIS, ROUTINE W REFLEX MICROSCOPIC
Glucose, UA: NEGATIVE mg/dL
HGB URINE DIPSTICK: NEGATIVE
Ketones, ur: 15 mg/dL — AB
NITRITE: NEGATIVE
Protein, ur: NEGATIVE mg/dL
Specific Gravity, Urine: 1.022 (ref 1.005–1.030)
UROBILINOGEN UA: 0.2 mg/dL (ref 0.0–1.0)
pH: 5 (ref 5.0–8.0)

## 2013-10-08 LAB — ETHANOL: Alcohol, Ethyl (B): <11 mg/dL (ref 0–11)

## 2013-10-08 LAB — URINE MICROSCOPIC-ADD ON

## 2013-10-08 LAB — COMPREHENSIVE METABOLIC PANEL
ALT: 24 U/L (ref 0–35)
AST: 19 U/L (ref 0–37)
Albumin: 2.9 g/dL — ABNORMAL LOW (ref 3.5–5.2)
Alkaline Phosphatase: 113 U/L (ref 39–117)
BUN: 12 mg/dL (ref 6–23)
CALCIUM: 9.1 mg/dL (ref 8.4–10.5)
CO2: 27 meq/L (ref 19–32)
CREATININE: 0.43 mg/dL — AB (ref 0.50–1.10)
Chloride: 99 mEq/L (ref 96–112)
Glucose, Bld: 110 mg/dL — ABNORMAL HIGH (ref 70–99)
Potassium: 3.3 mEq/L — ABNORMAL LOW (ref 3.7–5.3)
SODIUM: 140 meq/L (ref 137–147)
Total Bilirubin: 0.7 mg/dL (ref 0.3–1.2)
Total Protein: 5.9 g/dL — ABNORMAL LOW (ref 6.0–8.3)

## 2013-10-08 LAB — CBC
HCT: 34.1 % — ABNORMAL LOW (ref 36.0–46.0)
HCT: 34.9 % — ABNORMAL LOW (ref 36.0–46.0)
HEMOGLOBIN: 12 g/dL (ref 12.0–15.0)
Hemoglobin: 11.7 g/dL — ABNORMAL LOW (ref 12.0–15.0)
MCH: 30.6 pg (ref 26.0–34.0)
MCH: 31.1 pg (ref 26.0–34.0)
MCHC: 34.3 g/dL (ref 30.0–36.0)
MCHC: 34.4 g/dL (ref 30.0–36.0)
MCV: 89.3 fL (ref 78.0–100.0)
MCV: 90.4 fL (ref 78.0–100.0)
PLATELETS: 415 10*3/uL — AB (ref 150–400)
Platelets: 379 10*3/uL (ref 150–400)
RBC: 3.82 MIL/uL — ABNORMAL LOW (ref 3.87–5.11)
RBC: 3.86 MIL/uL — ABNORMAL LOW (ref 3.87–5.11)
RDW: 13.5 % (ref 11.5–15.5)
RDW: 13.5 % (ref 11.5–15.5)
WBC: 6.1 10*3/uL (ref 4.0–10.5)
WBC: 5.6 10*3/uL (ref 4.0–10.5)

## 2013-10-08 LAB — I-STAT TROPONIN, ED: TROPONIN I, POC: 0 ng/mL (ref 0.00–0.08)

## 2013-10-08 LAB — APTT: aPTT: 31 seconds (ref 24–37)

## 2013-10-08 LAB — CBG MONITORING, ED: GLUCOSE-CAPILLARY: 106 mg/dL — AB (ref 70–99)

## 2013-10-08 LAB — PROTIME-INR
INR: 0.97 (ref 0.00–1.49)
Prothrombin Time: 12.7 seconds (ref 11.6–15.2)

## 2013-10-08 MED ORDER — MORPHINE SULFATE 2 MG/ML IJ SOLN: 2.0000 mg | INTRAMUSCULAR | Status: DC | PRN

## 2013-10-08 MED ORDER — SODIUM CHLORIDE 0.9 % IV SOLN
INTRAVENOUS | Status: DC
Start: 2013-10-08 — End: 2013-10-09
  Administered 2013-10-08 – 2013-10-09 (×2): via INTRAVENOUS

## 2013-10-08 MED ORDER — HEPARIN SODIUM (PORCINE) 5000 UNIT/ML IJ SOLN
5000.0000 [IU] | Freq: Three times a day (TID) | INTRAMUSCULAR | Status: DC
  Administered 2013-10-08 – 2013-10-10 (×5): 5000 [IU] via SUBCUTANEOUS
  Filled 2013-10-08 (×8): qty 1

## 2013-10-08 MED ORDER — ONDANSETRON HCL 4 MG PO TABS: 4.0000 mg | ORAL_TABLET | Freq: Four times a day (QID) | ORAL | Status: DC | PRN

## 2013-10-08 MED ORDER — ACETAMINOPHEN 325 MG PO TABS: 650.0000 mg | ORAL_TABLET | Freq: Four times a day (QID) | ORAL | Status: DC | PRN

## 2013-10-08 MED ORDER — ONDANSETRON HCL 4 MG/2ML IJ SOLN: 4.0000 mg | Freq: Four times a day (QID) | INTRAMUSCULAR | Status: DC | PRN

## 2013-10-08 MED ORDER — SODIUM CHLORIDE 0.9 % IV BOLUS (SEPSIS)
500.0000 mL | Freq: Once | INTRAVENOUS | Status: AC
  Administered 2013-10-08: 500 mL via INTRAVENOUS

## 2013-10-08 MED ORDER — ACETAMINOPHEN 650 MG RE SUPP: 650.0000 mg | Freq: Four times a day (QID) | RECTAL | Status: DC | PRN

## 2013-10-08 NOTE — Assessment & Plan Note (Signed)
Treated with pain med in hospital and will do the same here if pain recurs

## 2013-10-08 NOTE — ED Notes (Addendum)
Per [facility]- Pt resides [facility] F[facility]. Per staff pt was unresponsive. Sternal rum also stuck pt with ink pin on bottom of left foot( marking remain) with no response). EMS called. Upon arrival on EMS. Pt  EMS arrival pt was responsive and wanted transport to hospital. Pt also c/o she does not like the nursing home she is currently staying. [facility] just [facility] at nursing home on Friday. Pt currently has Foley and a HX of UTI. Pt denies pain.

## 2013-10-08 NOTE — Assessment & Plan Note (Signed)
Pt was admitted 2/28 after a fall and L hip pain;no fx was noted but CT showed severe DJD and pt had hip replacement on 3/5. Post op complicated by uncontrolled pain; pt is on roxicet and Robaxin and ASA 325 BID for prophylaxis

## 2013-10-08 NOTE — Progress Notes (Signed)
MRN: [id number] Name: Kendra Snyder  Sex: female Age: 79 y.o. DOB: 1935-05-01  PSC #: Ronni Rumble Facility/Room: 130A Level Of Care: SNF Provider: Merrilee Seashore D Emergency Contacts: Extended Emergency Contact Information Primary Emergency Contact: Sexton,Sherry  Darden Amber of [place] Home Phone: [phone] Work Phone: [phone] Mobile Phone: [phone] Relation: Daughter Secondary Emergency Contact: Danella Penton, [place] [address] Darden Amber of [place] Home Phone: [phone] Relation: Friend  Code Status: FULL  Allergies: Other; Amoxicillin; Iohexol; and Penicillins  Chief Complaint  Patient presents with  . nursing home admission  . Acute Visit    HPI: Patient is 79 y.o. female who is being admitted to SNF for OT/PT after L total hip who is being seen acutely as well for report of being unresponsive.  Past Medical History  Diagnosis Date  . Renal disorder   . Blood transfusion   . Osteomyelitis   . Arthritis     Past Surgical History  Procedure Laterality Date  . Bladder surgery    . Total hip arthroplasty    . Tubal ligation    . Eye surgery    . Tonsillectomy    . Total hip arthroplasty Left 10/03/2013    Procedure: TOTAL LEFT HIP ARTHROPLASTY ANTERIOR APPROACH;  Surgeon: Shelda Pal, MD;  Location: [facility];  Service: Orthopedics;  Laterality: Left;  . Joint replacement        Medication List       This list is accurate as of: 10/08/13 11:22 AM.  Always use your most recent med list.               aspirin EC 325 MG tablet  Take 1 tablet (325 mg total) by mouth 2 (two) times daily. Take for 4 weeks.     DSS 100 MG Caps  Take 100 mg by mouth daily.     ferrous sulfate 325 (65 FE) MG tablet  Take 1 tablet (325 mg total) by mouth 3 (three) times daily after meals.     furosemide 20 MG tablet  Commonly known as:  LASIX  Take 1 tablet (20 mg total) by mouth daily.     methocarbamol 500 MG tablet  Commonly known as:   ROBAXIN  Take 1 tablet (500 mg total) by mouth every 6 (six) hours as needed for muscle spasms.     ondansetron 4 MG tablet  Commonly known as:  ZOFRAN  Take 1 tablet (4 mg total) by mouth every 6 (six) hours as needed for nausea.     oxyCODONE-acetaminophen 5-325 MG per tablet  Commonly known as:  ROXICET  Take 1-2 tablets by mouth every 4 (four) hours as needed for severe pain.     polyethylene glycol packet  Commonly known as:  MIRALAX / GLYCOLAX  Take 17 g by mouth daily.     tiZANidine 4 MG tablet  Commonly known as:  ZANAFLEX  Take 1 tablet (4 mg total) by mouth every 6 (six) hours as needed.        No orders of the defined types were placed in this encounter.     There is no immunization history on file for this patient.  History  Substance Use Topics  . Smoking status: Never Smoker   . Smokeless tobacco: Never Used  . Alcohol Use: No    Family history is noncontributory    Review of Systems  UTO from pt ;nurses report pt was admitted Sunday and no  problems reported  .   Filed Vitals:   10/08/13 1034  BP: 150/78  Pulse: 90  Temp: 98.5 F (36.9 C)  Resp: 16    Physical Exam  GENERAL APPEARANCE: unresponsive SKIN: No diaphoresis rash; surgical dressing L hip, no surrounding redness or heat HEAD: Normocephalic, atraumatic  EYES: Conjunctiva/lids clear. Pupils round, mid range, reactive to light and accomodation, Eyes are not moving   EARS: External exam WNL, pt not respond to voice NOSE: No deformity or discharge.  MOUTH/THROAT: Lips w/o lesions. Mouth and throat normal. Tongue moist, w/o lesion. + gag RESPIRATORY: Breathing is even, unlabored. Lung sounds are clear   CARDIOVASCULAR: Heart RRR no murmurs, rubs or gallops. No peripheral edema.  ARTERIAL: radial pulse 2+, DP pulse 1+  VENOUS: No varicosities. No venous stasis skin changes  GASTROINTESTINAL: Abdomen is soft, non-tender, not distended w/ normal bowel sounds GENITOURINARY: Bladder non  tender, not distended  MUSCULOSKELETAL: No abnormal joints or musculature NEUROLOGIC: unresponsive to voice, sternal rub,does not withdraw, does not posture,+ gag but does not respond otherwise,  flaccid BUE, minimal tone LE, neg babinski B  Patient Active Problem List   Diagnosis Date Noted  . Black stools 10/08/2013  . Acute blood loss anemia 10/08/2013  . Altered mental status 10/08/2013  . Unresponsive 10/08/2013  . S/P left THA 10/03/2013  . Left hip pain 09/30/2013  . Osteoarthritis of left hip 09/30/2013  . Fall 09/29/2013  . Generalized weakness 09/29/2013  . Hypokalemia 09/29/2013  . UTI (lower urinary tract infection) 09/28/2013  . Bilateral leg edema 09/28/2013  . FOOT PAIN 08/29/2008    CBC    Component Value Date/Time   WBC 5.6 10/08/2013 1243   RBC 3.86* 10/08/2013 1243   HGB 12.0 10/08/2013 1243   HCT 34.9* 10/08/2013 1243   PLT 379 10/08/2013 1243   MCV 90.4 10/08/2013 1243   LYMPHSABS 0.8 10/08/2013 1243   MONOABS 0.6 10/08/2013 1243   EOSABS 0.0 10/08/2013 1243   BASOSABS 0.0 10/08/2013 1243    CMP     Component Value Date/Time   NA 140 10/08/2013 1146   K 3.3* 10/08/2013 1146   CL 99 10/08/2013 1146   CO2 27 10/08/2013 1146   GLUCOSE 110* 10/08/2013 1146   BUN 12 10/08/2013 1146   CREATININE 0.43* 10/08/2013 1146   CALCIUM 9.1 10/08/2013 1146   PROT 5.9* 10/08/2013 1146   ALBUMIN 2.9* 10/08/2013 1146   AST 19 10/08/2013 1146   ALT 24 10/08/2013 1146   ALKPHOS 113 10/08/2013 1146   BILITOT 0.7 10/08/2013 1146   GFRNONAA >90 10/08/2013 1146   GFRAA >90 10/08/2013 1146    Assessment and Plan  UNRESPONSIVE -nurses report they found the pt around 8 am unresponsive to voice, touch, sternal rub and pinprick to feet.Limbs reported as flaccid. Pt was admitted 3/8 to SNF;no problems known since admission. When I arrived 0920 I found the pt to be unresponsive as well to voice and vigorous sternal rub. She did have a gag reflex but did not awaken to it. Pupils were midrange  and reactive, negative Babinski B, flaccid BUE but some tone in leg because when it was flexed at the knee in the bed it stayed flexed. Pt has foley catheter and had no loss of bowel. Appears most like she is post ictal, although there is no hx of seizure. Over the next 20 minutes I did several repeat exams and pt was starting to lighten up, first by trying to open her eyes,  then  being able to. Ten  minutes later she was able to squeeze her fingers minimally  to command. At this time she was making eye contact but didn't speak although she did cough. Pt being sent to ED for acute work-up   S/P left THA Pt was admitted 2/28 after a fall and L hip pain;no fx was noted but CT showed severe DJD and pt had hip replacement on 3/5. Post op complicated by uncontrolled pain; pt is on roxicet and Robaxin and ASA 325 BID for prophylaxis  UTI (lower urinary tract infection) Pt received 5 days of Cipro but urine cx was neg  FOOT PAIN Treated with pain med in hospital and will do the same here if pain recurs  Black stools FOBT negative at hospital  Bilateral leg edema 2D ECHO on 3/1, EF 65-70% no diastolic dysfunction; lasix dose was dec due to low diastolic BP  Hypokalemia Repleted and WNL on discharge  Acute blood loss anemia Pre-op Hb 12.7 and post op Hb was 10.9; will follow    Margit HanksALEXANDER, Elyan Vanwieren D, MD

## 2013-10-08 NOTE — Progress Notes (Signed)
Clinical Social Work Department BRIEF PSYCHOSOCIAL ASSESSMENT 10/08/2013  Patient:  Kendra Snyder, Kendra Snyder     Account Number:  [id number]     Admit date:  10/08/2013  Clinical Social Worker:  [employer]  Date/Time:  10/08/2013 02:30 PM  Referred by:  CSW  Date Referred:  10/08/2013 Referred for  SNF Placement   Other Referral:   Interview type:  Patient Other interview type:    PSYCHOSOCIAL DATA Living Status:  FACILITY Admitted from facility:  [facility] Level of care:  [place] Primary support name:  Sherrie Sextong Primary support relationship to patient:  CHILD, ADULT Degree of support available:   moderate, lives in [place] Current Concerns  Post-Acute Placement   Other Concerns:    [place] / PLAN CSW met with pt at bedside. Patient shared she would really like to go home however understand she may need to return to [facility] when medically stable to continue short term rehab. Patient spoke with [facility] regarding her concerns at the facility. CSW awaiting futher medical evaluation to determine pt disposition needs. Patient had voiced that she did not want to return to [facility], however due to patient insurance, options are limited due to contracts.      CSW spoke further, patient is not upset with anyone at the facility. Pt just wishes to return home as soon as possible.   Assessment/plan status:  Psychosocial Support/Ongoing Assessment of Needs Other assessment/ plan:   Information/referral to community resources:   none identified at this time    PATIENT'S/FAMILY'S RESPONSE TO PLAN OF CARE: Patient thanked csw for concern and support. Pt disposition depends upon returning home or returning to [facility].       Noreene Larsson [phone]  ED CSW 10/08/2013 1535pm

## 2013-10-08 NOTE — Assessment & Plan Note (Signed)
Pt received 5 days of Cipro but urine cx was neg

## 2013-10-08 NOTE — ED Notes (Addendum)
SHERRI SEXTON DAUGHTER CELL [phone]. WORK [phone]

## 2013-10-08 NOTE — ED Notes (Signed)
LATASHA FROM [facility] HERE TO SPEAK WITH PT. PT ALERT AND ACTIVE WITH CARE. SMILING AND COOPERATIVE.

## 2013-10-08 NOTE — Progress Notes (Signed)
Called to advise EEG will not be done until tomorrow.

## 2013-10-08 NOTE — H&P (Signed)
[facility] History and Physical  [name] [facility] ZOX:[id number] DOB: Dec 01, 1934 DOA: 10/08/2013  Referring physician: Emergency department PCP: [name], MD  Specialists:   Chief Complaint: unresponsiveness  HPI: Kendra Snyder is a 79 y.o. female  With a hx of prior GI bleed, depression, and osteoarthritis who was recently admitted for hip fracture, s/p hip replacement on 3/5. The patient was discharged to SNF for rehab. On the AM of admission, staff reported the patient to be suddenly unresponsive while laying in bed. No seizure-like activity was noted. The patient was noted to have confusion that gradually subsided by the time of her ED visit. Head CT was unremarkable. Given concerns of unresponsiveness, the hospitalist was consulted for admission.  On further questioning, the patient reported feeling increasingly "hot" just prior to the event and recalled "wetting myself." Denies post-ictal symptoms.   Review of Systems:  Per   Past Medical History  Diagnosis Date  . Renal disorder   . Blood transfusion   . Osteomyelitis   . Arthritis    Past Surgical History  Procedure Laterality Date  . Bladder surgery    . Total hip arthroplasty    . Tubal ligation    . Eye surgery    . Tonsillectomy    . Total hip arthroplasty Left 10/03/2013    Procedure: TOTAL LEFT HIP ARTHROPLASTY ANTERIOR APPROACH;  Surgeon: Shelda Pal, MD;  Location: [facility];  Service: Orthopedics;  Laterality: Left;  . Joint replacement     Social History:  reports that she has never smoked. She does not have any smokeless tobacco history on file. She reports that she does not drink alcohol or use illicit drugs.  where does patient live--home, ALF, SNF? and with whom if at home?  Can patient participate in ADLs?  Allergies  Allergen Reactions  . Other Shortness Of Breath and Palpitations    Patient is allergic to all antibiotics  She states she has to be tested before receiving antibiotics    . Amoxicillin   . Iohexol      Desc: POSS IV CONTRAST ALLERGY NOTED ON CT A/P FROM 2005. UNABLE TO DEFINITELY CONFIRM W/ PT, PT'S FRIEND OR OFFICE. DONE W/O IV PER RAD ON 08/16/10, JB, Onset Date: [id number]   . Penicillins     No family history on file.  (be sure to complete)  Prior to Admission medications   Medication Sig Start Date End Date Taking? Authorizing Provider  aspirin EC 325 MG tablet Take 1 tablet (325 mg total) by mouth 2 (two) times daily. Take for 4 weeks. 10/03/13 11/02/13 Yes Genelle Gather Babish, PA-C  docusate sodium 100 MG CAPS Take 100 mg by mouth daily. 10/04/13  Yes Genelle Gather Babish, PA-C  ferrous sulfate 325 (65 FE) MG tablet Take 1 tablet (325 mg total) by mouth 3 (three) times daily after meals. 10/04/13  Yes Genelle Gather Babish, PA-C  furosemide (LASIX) 20 MG tablet Take 1 tablet (20 mg total) by mouth daily. 10/06/13  Yes [name], MD  ondansetron (ZOFRAN) 4 MG tablet Take 1 tablet (4 mg total) by mouth every 6 (six) hours as needed for nausea. 10/06/13  Yes [name], MD  oxyCODONE-acetaminophen (PERCOCET/ROXICET) 5-325 MG per tablet Take 1 tablet by mouth every 4 (four) hours as needed for severe pain (For hip joint replacement).   Yes Historical Provider, MD  polyethylene glycol (MIRALAX / GLYCOLAX) packet Take 17 g by mouth daily. 10/04/13  Yes Genelle Gather [place],  PA-C  tiZANidine (ZANAFLEX) 4 MG tablet Take 4 mg by mouth every 6 (six) hours as needed for muscle spasms.   Yes Historical Provider, MD   Physical Exam: Filed Vitals:   10/08/13 1252 10/08/13 1330 10/08/13 1400 10/08/13 1412  BP:  169/75 159/61   Pulse:  101  91  Temp: 99 F (37.2 C)     TempSrc:      Resp:  15 18 17   SpO2:  97%  97%     General:  Awake, in nad  Eyes: PERRL B  ENT: Membranes moist, dentition fair  Neck: trachea midline, neck supple  Cardiovascular: regular, s1, s2  Respiratory: normal resp effort, no wheezing  Abdomen: soft, nondistended  Skin: normal skin  turgor, no abnormal skin lesions seen  Musculoskeletal: perfused, no clubbing  Psychiatric: mood/affect normal // no auditory/visual hallucinations  Neurologic: cn2-12 grossly intact, strength/sensation intact  Labs on Admission:  Basic Metabolic Panel:  Recent Labs Lab 10/04/13 0403 10/05/13 0408 10/08/13 1146  NA 135* 137 140  K 5.1 4.3 3.3*  CL 98 100 99  CO2 27 28 27   GLUCOSE 204* 128* 110*  BUN 17 15 12   CREATININE 0.55 0.47* 0.43*  CALCIUM 8.9 8.9 9.1   Liver Function Tests:  Recent Labs Lab 10/08/13 1146  AST 19  ALT 24  ALKPHOS 113  BILITOT 0.7  PROT 5.9*  ALBUMIN 2.9*   No results found for this basename: LIPASE, AMYLASE,  in the last 168 hours No results found for this basename: AMMONIA,  in the last 168 hours CBC:  Recent Labs Lab 10/04/13 0403 10/05/13 0408 10/08/13 1146 10/08/13 1243  WBC 8.3 10.5 6.1 5.6  NEUTROABS  --   --   --  4.1  HGB 10.8* 10.9* 11.7* 12.0  HCT 31.4* 32.9* 34.1* 34.9*  MCV 91.0 91.9 89.3 90.4  PLT 278 279 415* 379   Cardiac Enzymes: No results found for this basename: CKTOTAL, CKMB, CKMBINDEX, TROPONINI,  in the last 168 hours  BNP (last 3 results)  Recent Labs  09/29/13 0424  PROBNP 308.0   CBG:  Recent Labs Lab 10/08/13 1122  GLUCAP 106*    Radiological Exams on Admission: Dg Chest 2 View  10/08/2013   CLINICAL DATA:  Altered mental status. Fever. Urinary tract infection.  EXAM: CHEST  2 VIEW  COMPARISON:  01/27/2009  FINDINGS: Linear opacity in the retrocardiac lung. No consolidation. No edema, significant effusion, or pneumothorax. Normal heart size. Senescent osseous changes.  IMPRESSION: Mild retrocardiac atelectasis.   Electronically Signed   By: Tiburcio Pea M.D.   On: 10/08/2013 13:02   Ct Head Wo Contrast  10/08/2013   CLINICAL DATA:  Altered mental status.  Unresponsive patient.  EXAM: CT HEAD WITHOUT CONTRAST  TECHNIQUE: Contiguous axial images were obtained from the base of the skull  through the vertex without intravenous contrast.  COMPARISON:  CT HEAD W/O CM dated 09/28/2013  FINDINGS: No mass lesion, mass effect, midline shift, hydrocephalus, hemorrhage. No territorial ischemia or acute infarction. Calvarium intact. Paranasal sinuses are within normal limits.  IMPRESSION: No acute intracranial abnormality.  Age-appropriate atrophy.   Electronically Signed   By: Andreas Newport M.D.   On: 10/08/2013 12:44    EKG: Independently reviewed. Sinus with PVC  Assessment/Plan Active Problems:   Altered mental status   Unresponsive   1. Unresponsiveness 1. CT head unremarkable for acute pathology 2. Consider MRI 3. Will order EEG to r/o seizures 4. Per history, may also  consider vasovagal event 5. Monitor for now 2. Recent hip fracture 1. S/p surgery 2. Consult PT/Ot while inpt 3. DVT prophylaxis 1. Heparin subq  Code Status: Full (must indicate code status--if unknown or must be presumed, indicate so) Family Communication: Pt in room (indicate person spoken with, if applicable, with phone number if by telephone) Disposition Plan: Pending (indicate anticipated LOS)  Time spent: 35min  Terris Germano K [facility] Pager [phone]  If 7PM-7AM, please contact night-coverage [email or web address] Password [email or web address] 10/08/2013, 3:07 PM

## 2013-10-08 NOTE — Assessment & Plan Note (Signed)
Pre-op Hb 12.7 and post op Hb was 10.9; will follow

## 2013-10-08 NOTE — ED Notes (Signed)
PER AMANDA PHARM TECH CALLED STAFF AND REPORTED PT TOOK PERCOCET 1 TABLET AT 0845.EDP LINKER MADE AWARE. NO NEW ORDERS

## 2013-10-08 NOTE — Progress Notes (Signed)
CSW met with pt at bedside. Patient shared she would really like to go home however understand she may need to return to [facility] when medically stable to continue short term rehab. Patient spoke with [facility] regarding her concerns at the facility. CSW awaiting futher medical evaluation to determine pt disposition needs. Patient had voiced that she did not want to return to [facility], however due to patient insurance, options are limited due to contracts.   Noreene Larsson [phone]  ED CSW 10/08/2013 1435pm

## 2013-10-08 NOTE — ED Notes (Signed)
Patient transported to CT 

## 2013-10-08 NOTE — Assessment & Plan Note (Signed)
Repleted and WNL on discharge

## 2013-10-08 NOTE — Assessment & Plan Note (Signed)
2D ECHO on 3/1, EF 65-70% no diastolic dysfunction; lasix dose was dec due to low diastolic BP

## 2013-10-08 NOTE — ED Notes (Signed)
MD at bedside. 

## 2013-10-08 NOTE — ED Provider Notes (Signed)
CSN: [id number]     Arrival date & time 10/08/13  1111 History   First MD Initiated Contact with Patient 10/08/13 1129     Chief Complaint  Patient presents with  . Altered Mental Status  . Fever  . [facility] SURG[facility]   . Urinary Tract Infection     (Consider location/radiation/quality/duration/timing/severity/associated sxs/prior Treatment) HPI Pt presenting from SNF with episode of altered mental status.  Per chart review and EMS patient was unresponsive this morning to sternal rub, voice, pain.  She slowly became more arousable over approx 20 minutes.  Pt was admitted to SNF 2 days ago after hip replacement.   Upon arrival to the ED patient is oriented x 3.  States she does not remember the event this morning.  Remembers staff waking her up and calling her name.  States she felt that she was "far away".  Currently she states she is feeling improved.  Symptoms are resolved.  Episodic.  Symptoms were severe.  Per chart she had a perocoet at 8:45am, she was noted to have midrange pupils and Cbg was reassuring.  There are no other associated systemic symptoms, there are no other alleviating or modifying factors.   Past Medical History  Diagnosis Date  . Renal disorder   . Blood transfusion   . Osteomyelitis   . Arthritis    Past Surgical History  Procedure Laterality Date  . Bladder surgery    . Total hip arthroplasty    . Tubal ligation    . Eye surgery    . Tonsillectomy    . Total hip arthroplasty Left 10/03/2013    Procedure: TOTAL LEFT HIP ARTHROPLASTY ANTERIOR APPROACH;  Surgeon: Shelda PalMatthew D Olin, MD;  Location: [facility];  Service: Orthopedics;  Laterality: Left;  . Joint replacement     No family history on file. History  Substance Use Topics  . Smoking status: Never Smoker   . Smokeless tobacco: Not on file  . Alcohol Use: No   OB History   Grav Para Term Preterm Abortions TAB SAB Ect Mult Living                 Review of Systems ROS  reviewed and all otherwise negative except for mentioned in HPI    Allergies  Other; Amoxicillin; Iohexol; and Penicillins  Home Medications   Current Outpatient Rx  Name  Route  Sig  Dispense  Refill  . aspirin EC 325 MG tablet   Oral   Take 1 tablet (325 mg total) by mouth 2 (two) times daily. Take for 4 weeks.   60 tablet   0     Take for 4 weeks.   . docusate sodium 100 MG CAPS   Oral   Take 100 mg by mouth daily.   10 capsule   0   . ferrous sulfate 325 (65 FE) MG tablet   Oral   Take 1 tablet (325 mg total) by mouth 3 (three) times daily after meals.      3     2-3 weeks   . furosemide (LASIX) 20 MG tablet   Oral   Take 1 tablet (20 mg total) by mouth daily.   30 tablet   0   . ondansetron (ZOFRAN) 4 MG tablet   Oral   Take 1 tablet (4 mg total) by mouth every 6 (six) hours as needed for nausea.   20 tablet   0   .  oxyCODONE-acetaminophen (PERCOCET/ROXICET) 5-325 MG per tablet   Oral   Take 1 tablet by mouth every 4 (four) hours as needed for severe pain (For hip joint replacement).         . polyethylene glycol (MIRALAX / GLYCOLAX) packet   Oral   Take 17 g by mouth daily.   14 each   0   . tiZANidine (ZANAFLEX) 4 MG tablet   Oral   Take 4 mg by mouth every 6 (six) hours as needed for muscle spasms.          BP 159/61  Pulse 91  Temp(Src) 99 F (37.2 C) (Rectal)  Resp 17  SpO2 97% Vitals reviewed Physical Exam Physical Examination: General appearance - alert, tired appearing, and in no distress Mental status - alert, oriented to person, place, and time Eyes - pupils equal and reactive, extraocular eye movements intact Mouth - mucous membranes dry, OP clear Chest - clear to auscultation, no wheezes, rales or rhonchi, symmetric air entry Heart - normal rate, regular rhythm, normal S1, S2, no murmurs, rubs, clicks or gallops Abdomen - soft, nontender, nondistended, no masses or organomegaly Neurological - alert, oriented x 3, cranial  nerves 2-12 tested and intact, strength 5/5 in extremities x 4, sensation intact Extremities - peripheral pulses normal, no pedal edema, no clubbing or cyanosis Skin - normal coloration and turgor, no rashes  ED Course  Procedures (including critical care time)  2:51 PM d/w Dr. Rhona Leavens, [facility] for admission.  Pt to be admitted to med/surg bed to observation.  Labs Review Labs Reviewed  CBC - Abnormal; Notable for the following:    RBC 3.82 (*)    Hemoglobin 11.7 (*)    HCT 34.1 (*)    Platelets 415 (*)    All other components within normal limits  COMPREHENSIVE METABOLIC PANEL - Abnormal; Notable for the following:    Potassium 3.3 (*)    Glucose, Bld 110 (*)    Creatinine, Ser 0.43 (*)    Total Protein 5.9 (*)    Albumin 2.9 (*)    All other components within normal limits  URINALYSIS, ROUTINE W REFLEX MICROSCOPIC - Abnormal; Notable for the following:    Color, Urine AMBER (*)    APPearance CLOUDY (*)    Bilirubin Urine SMALL (*)    Ketones, ur 15 (*)    Leukocytes, UA MODERATE (*)    All other components within normal limits  URINE MICROSCOPIC-ADD ON - Abnormal; Notable for the following:    Bacteria, UA FEW (*)    All other components within normal limits  CBC - Abnormal; Notable for the following:    RBC 3.86 (*)    HCT 34.9 (*)    All other components within normal limits  CBG MONITORING, ED - Abnormal; Notable for the following:    Glucose-Capillary 106 (*)    All other components within normal limits  ETHANOL  DIFFERENTIAL  URINE RAPID DRUG SCREEN (HOSP PERFORMED)  PROTIME-INR  APTT  CBC  CREATININE, SERUM  I-STAT TROPOININ, ED   Imaging Review Dg Chest 2 View  10/08/2013   CLINICAL DATA:  Altered mental status. Fever. Urinary tract infection.  EXAM: CHEST  2 VIEW  COMPARISON:  01/27/2009  FINDINGS: Linear opacity in the retrocardiac lung. No consolidation. No edema, significant effusion, or pneumothorax. Normal heart size. Senescent osseous changes.   IMPRESSION: Mild retrocardiac atelectasis.   Electronically Signed   By: Tiburcio Pea M.D.   On: 10/08/2013 13:02  Ct Head Wo Contrast  10/08/2013   CLINICAL DATA:  Altered mental status.  Unresponsive patient.  EXAM: CT HEAD WITHOUT CONTRAST  TECHNIQUE: Contiguous axial images were obtained from the base of the skull through the vertex without intravenous contrast.  COMPARISON:  CT HEAD W/O CM dated 09/28/2013  FINDINGS: No mass lesion, mass effect, midline shift, hydrocephalus, hemorrhage. No territorial ischemia or acute infarction. Calvarium intact. Paranasal sinuses are within normal limits.  IMPRESSION: No acute intracranial abnormality.  Age-appropriate atrophy.   Electronically Signed   By: Andreas Newport M.D.   On: 10/08/2013 12:44     EKG Interpretation None      Date: 10/08/2013  Rate: 92  Rhythm: normal sinus rhythm  QRS Axis: normal  Intervals: normal  ST/T Wave abnormalities: normal  Conduction Disutrbances:none  Narrative Interpretation: isolated PVC  Old EKG Reviewed: no significant changes compared to prior  MDM   Final diagnoses:  Altered mental status   Pt presenting with c/o altered mental status, she was per report unresponsive at SNF this morning.  She became arousable and slowly returned to her normal mental status.  Alert and oriented x 3 during ED evaluation with normal neuro exam.  CBG normal.  UA shows WBC and some bacteria- pt was just treated with cipro for a culture negative UTI- given the acute nature of this episode will not treat at this time with abx- urine culture pending.  Pt has no cp or sob or tachycardia to suggest PE.  Head CT without acute changes, EKG reassuring as well.  No drop in hemoglobin.  D/w [facility] for admission for observation and further workup for possible TIA or other causes.     Ethelda Chick, MD 10/08/13 1520

## 2013-10-08 NOTE — Assessment & Plan Note (Signed)
FOBT negative at hospital

## 2013-10-09 ENCOUNTER — Encounter (HOSPITAL_COMMUNITY): Payer: Self-pay | Admitting: Neurology

## 2013-10-09 ENCOUNTER — Inpatient Hospital Stay (HOSPITAL_COMMUNITY)
Admit: 2013-10-09 | Discharge: 2013-10-09 | Disposition: A | Discharge status: Home / Self Care | Payer: Medicare HMO | Attending: Internal Medicine | Admitting: Internal Medicine

## 2013-10-09 ENCOUNTER — Other Ambulatory Visit (HOSPITAL_COMMUNITY): Payer: Medicare Other

## 2013-10-09 DIAGNOSIS — I639 Cerebral infarction, unspecified: Secondary | ICD-10-CM

## 2013-10-09 DIAGNOSIS — D62 Acute posthemorrhagic anemia: Secondary | ICD-10-CM

## 2013-10-09 DIAGNOSIS — E876 Hypokalemia: Secondary | ICD-10-CM

## 2013-10-09 LAB — COMPREHENSIVE METABOLIC PANEL
ALT: 23 U/L (ref 0–35)
AST: 18 U/L (ref 0–37)
Albumin: 2.7 g/dL — ABNORMAL LOW (ref 3.5–5.2)
Alkaline Phosphatase: 108 U/L (ref 39–117)
BILIRUBIN TOTAL: 0.6 mg/dL (ref 0.3–1.2)
BUN: 10 mg/dL (ref 6–23)
CHLORIDE: 102 meq/L (ref 96–112)
CO2: 29 meq/L (ref 19–32)
Calcium: 8.7 mg/dL (ref 8.4–10.5)
Creatinine, Ser: 0.42 mg/dL — ABNORMAL LOW (ref 0.50–1.10)
GFR calc non Af Amer: >90 mL/min (ref >90)
Glucose, Bld: 97 mg/dL (ref 70–99)
Potassium: 3.2 mEq/L — ABNORMAL LOW (ref 3.7–5.3)
Sodium: 141 mEq/L (ref 137–147)
Total Protein: 5.5 g/dL — ABNORMAL LOW (ref 6.0–8.3)

## 2013-10-09 LAB — CBC
HCT: 32.1 % — ABNORMAL LOW (ref 36.0–46.0)
Hemoglobin: 10.7 g/dL — ABNORMAL LOW (ref 12.0–15.0)
MCH: 30.4 pg (ref 26.0–34.0)
MCHC: 33.3 g/dL (ref 30.0–36.0)
MCV: 91.2 fL (ref 78.0–100.0)
PLATELETS: 370 10*3/uL (ref 150–400)
RBC: 3.52 MIL/uL — AB (ref 3.87–5.11)
RDW: 13.6 % (ref 11.5–15.5)
WBC: 7.1 10*3/uL (ref 4.0–10.5)

## 2013-10-09 LAB — HEMOGLOBIN A1C
Hgb A1c MFr Bld: 5.7 % — ABNORMAL HIGH (ref <5.7)
MEAN PLASMA GLUCOSE: 117 mg/dL — AB (ref <117)

## 2013-10-09 LAB — LIPID PANEL
Cholesterol: 182 mg/dL (ref 0–200)
HDL: 54 mg/dL (ref >39)
LDL Cholesterol: 114 mg/dL — ABNORMAL HIGH (ref 0–99)
TRIGLYCERIDES: 70 mg/dL (ref <150)
Total CHOL/HDL Ratio: 3.4 RATIO
VLDL: 14 mg/dL (ref 0–40)

## 2013-10-09 MED ORDER — ASPIRIN EC 325 MG PO TBEC
325.0000 mg | DELAYED_RELEASE_TABLET | Freq: Every day | ORAL | Status: DC
  Administered 2013-10-09: 325 mg via ORAL
  Filled 2013-10-09 (×2): qty 1

## 2013-10-09 MED ORDER — LISINOPRIL 5 MG PO TABS
5.0000 mg | ORAL_TABLET | Freq: Every day | ORAL | Status: DC
  Administered 2013-10-09 – 2013-10-10 (×2): 5 mg via ORAL
  Filled 2013-10-09 (×2): qty 1

## 2013-10-09 MED ORDER — POTASSIUM CHLORIDE CRYS ER 20 MEQ PO TBCR
40.0000 meq | EXTENDED_RELEASE_TABLET | Freq: Once | ORAL | Status: AC
  Administered 2013-10-09: 40 meq via ORAL
  Filled 2013-10-09: qty 2

## 2013-10-09 NOTE — Evaluation (Signed)
Occupational Therapy Evaluation Patient Details Name: Kendra ApplebaumJulia M Edmondson MRN: [id number] DOB: 07/15/1935 Today's Date: 10/09/2013 Time: [id number] OT Time Calculation (min): 37 min  OT Assessment / Plan / Recommendation History of present illness Pt presenting from SNF with episode of altered mental status.  Per chart review and EMS patient was unresponsive this morning to sternal rub, voice, pain.  She slowly became more arousable over approx 20 minutes.  Pt was admitted to SNF 2 days ago after hip replacement.   Upon arrival to the ED patient is oriented x 3.  States she does not remember the event this morning.  Remembers staff waking her up and calling her name.  States she felt that she was "far away".     Clinical Impression   Pt presents to OT with decreased I with ADL activity due to problems listed below. Pt will benefit from skilled OT to increase I with ADL activity and return to PLOF    OT Assessment  Patient needs continued OT Services    Follow Up Recommendations  SNF    Barriers to Discharge Decreased caregiver support          Frequency  Min 2X/week    Precautions / Restrictions Precautions Precautions: Fall       ADL  Grooming: Set up Where Assessed - Grooming: Unsupported sitting Upper Body Bathing: Set up Where Assessed - Upper Body Bathing: Unsupported sitting Lower Body Bathing: Maximal assistance Where Assessed - Lower Body Bathing: Supported sit to stand Upper Body Dressing: Set up Where Assessed - Upper Body Dressing: Unsupported sitting Lower Body Dressing: Maximal assistance Where Assessed - Lower Body Dressing: Supported sit to Pharmacist, hospitalstand Toilet Transfer: Moderate assistance Toilet Transfer Method: Sit to Barist[place]and Toilet Transfer Equipment: Bedside commode Toileting - Clothing Manipulation and Hygiene: Moderate assistance Equipment Used: Rolling walker    OT Diagnosis: Generalized weakness  OT Problem List: Decreased strength;Decreased range of  motion;Decreased activity tolerance;Impaired balance (sitting and/or standing);Decreased safety awareness;Decreased knowledge of use of DME or AE;Decreased knowledge of precautions;Pain OT Treatment Interventions: Self-care/ADL training;Therapeutic exercise;DME and/or AE instruction;Therapeutic activities;Patient/family education;Balance training   OT Goals(Current goals can be found in the care plan section) Acute Rehab OT Goals Patient Stated Goal: get back to being independent OT Goal Formulation: With patient Time For Goal Achievement: 10/23/13 Potential to Achieve Goals: Good  Visit Information  Last OT Received On: 10/09/13 Assistance Needed: +1 Reason Eval/Treat Not Completed: Patient at procedure or test/ unavailable History of Present Illness: Pt presenting from SNF with episode of altered mental status.  Per chart review and EMS patient was unresponsive this morning to sternal rub, voice, pain.  She slowly became more arousable over approx 20 minutes.  Pt was admitted to SNF 2 days ago after hip replacement.   Upon arrival to the ED patient is oriented x 3.  States she does not remember the event this morning.  Remembers staff waking her up and calling her name.  States she felt that she was "far away".         Prior Functioning     Home Living Family/patient expects to be discharged to:: Skilled nursing facility         Vision/Perception Vision - History Baseline Vision:  (glasses not here)   Cognition  Cognition Arousal/Alertness: Awake/alert Behavior During Therapy: WFL for tasks assessed/performed Overall Cognitive Status: Within Functional Limits for tasks assessed Area of Impairment: Safety/judgement;Problem solving Safety/Judgement: Decreased awareness of deficits General Comments: Pt with decreased awareness of activities she  will have to do to be able to take care of herself.  Pt has no support at home.    Extremity/Trunk Assessment Upper Extremity  Assessment Upper Extremity Assessment: Generalized weakness LUE Deficits / Details: LUE shoulder sore - but better!     Mobility Bed Mobility Overal bed mobility: Needs Assistance Bed Mobility: Sit to Supine Supine to sit: Mod assist Sit to supine: Mod assist Transfers Overall transfer level: Needs assistance Equipment used: Rolling walker (2 wheeled) Transfers: Sit to/from Stand Sit to Stand: Mod assist Stand pivot transfers: Mod assist General transfer comment: VC for handplacement and safety.           End of Session OT - End of Session Activity Tolerance: Patient tolerated treatment well Patient left: in bed;with call bell/phone within reach;with bed alarm set Nurse Communication: Mobility status  GO     Alba Cory 10/09/2013, 1:01 PM

## 2013-10-09 NOTE — Progress Notes (Signed)
UR completed. Patient changed to inpatient- +CVA 

## 2013-10-09 NOTE — Evaluation (Signed)
Physical Therapy Evaluation Patient Details Name: Kendra ApplebaumJulia M Glad MRN: [id number] DOB: 02/24/1935 Today's Date: 10/09/2013 Time: 1015-1100 PT Time Calculation (min): 45 min  PT Assessment / Plan / Recommendation History of Present Illness  Pt presenting from SNF with episode of altered mental status.  Per chart review and EMS patient was unresponsive this morning to sternal rub, voice, pain.  She slowly became more arousable over approx 20 minutes.  Pt was admitted to SNF 2 days ago after hip replacement.   Upon arrival to the ED patient is oriented x 3.  States she does not remember the event this morning.  Remembers staff waking her up and calling her name.  States she felt that she was "far away".    Clinical Impression  Pt currently presenting with needs for mod assist to complete mobility tasks and limited by fatigue and L hip discomfort.  Pt would benefit from follow up rehab at SNF level to maximize IND and safety prior to return home with limited assist.    PT Assessment  Patient needs continued PT services    Follow Up Recommendations  SNF    Does the patient have the potential to tolerate intense rehabilitation      Barriers to Discharge Decreased caregiver support      Equipment Recommendations  None recommended by PT    Recommendations for Other Services OT consult   Frequency Min 6X/week    Precautions / Restrictions Precautions Precautions: Fall Restrictions Weight Bearing Restrictions: No Other Position/Activity Restrictions: WBAT   Pertinent Vitals/Pain 3-4/10      Mobility  Bed Mobility Overal bed mobility: Needs Assistance Bed Mobility: Sit to Supine Supine to sit: Mod assist Sit to supine: Mod assist General bed mobility comments: cues for sequence and use of R LE to self assist. Transfers Overall transfer level: Needs assistance Equipment used: Rolling walker (2 wheeled) Transfers: Sit to/from Stand Sit to Stand: Mod assist Stand pivot  transfers: Mod assist General transfer comment: VC for handplacement and safety. Ambulation/Gait Ambulation/Gait assistance: Mod assist Ambulation Distance (Feet): 48 Feet Assistive device: Rolling walker (2 wheeled) Gait Pattern/deviations: Step-to pattern;Step-through pattern;Decreased step length - right;Decreased step length - left;Shuffle;Antalgic;Trunk flexed General Gait Details: cues for posture, position from RW, increased BOS and ER on R,  and initial sequence    Exercises Total Joint Exercises Ankle Circles/Pumps: 15 reps;AAROM;AROM;Supine;Both Quad Sets: AROM;Both;10 reps;Supine Heel Slides: AAROM;15 reps;Supine;Left Hip ABduction/ADduction: AAROM;Left;15 reps;Supine   PT Diagnosis: Difficulty walking  PT Problem List: Decreased strength;Decreased activity tolerance;Decreased range of motion;Decreased mobility;Decreased knowledge of use of DME;Pain;Decreased knowledge of precautions;Decreased safety awareness PT Treatment Interventions: DME instruction;Gait training;Functional mobility training;Therapeutic activities;Therapeutic exercise;Patient/family education     PT Goals(Current goals can be found in the care plan section) Acute Rehab PT Goals Patient Stated Goal: get back to being independent PT Goal Formulation: With patient Time For Goal Achievement: 10/12/13 Potential to Achieve Goals: Fair  Visit Information  Last PT Received On: 10/09/13 Assistance Needed: +1 History of Present Illness: Pt presenting from SNF with episode of altered mental status.  Per chart review and EMS patient was unresponsive this morning to sternal rub, voice, pain.  She slowly became more arousable over approx 20 minutes.  Pt was admitted to SNF 2 days ago after hip replacement.   Upon arrival to the ED patient is oriented x 3.  States she does not remember the event this morning.  Remembers staff waking her up and calling her name.  States she felt that she  was "far away".         Prior  Functioning  Home Living Family/patient expects to be discharged to:: Skilled nursing facility Living Arrangements: Alone Additional Comments: Pt states she plans to go for more rehab but does not want to go back to same facility Prior Function Level of Independence: Needs assistance Gait / Transfers Assistance Needed: PTA pt ambulated with rollator in community setting . pt only used rollator as w/c in the home setting due to pain.  ADL's / Homemaking Assistance Needed: pt self reports difficulty and poor management. Pt states "ive been living worse than a rat" Pt very tearful and expressed concern with home life. Pt wearing diapers but to save money does not wear pants at home. instead pt stands and attempts to void into bottles. Pt was not using a urinal so catching urinal was not always successful. Pt incr risk of fall due to wet floor with incontinence  Communication Communication: No difficulties Dominant Hand: Right    Cognition  Cognition Arousal/Alertness: Awake/alert Behavior During Therapy: WFL for tasks assessed/performed Overall Cognitive Status: Within Functional Limits for tasks assessed Area of Impairment: Safety/judgement;Problem solving Safety/Judgement: Decreased awareness of deficits Awareness: Anticipatory Problem Solving: Slow processing General Comments: Pt with decreased awareness of activities she will have to do to be able to take care of herself.  Pt has no support at home.    Extremity/Trunk Assessment Upper Extremity Assessment Upper Extremity Assessment: Generalized weakness LUE Deficits / Details: LUE shoulder sore - but better! Lower Extremity Assessment Lower Extremity Assessment: LLE deficits/detail LLE Deficits / Details: Hip strength 2/5 with AAROM at hip to 80 flex and 10 abd Cervical / Trunk Assessment Cervical / Trunk Assessment: Kyphotic   Balance    End of Session PT - End of Session Equipment Utilized During Treatment: Gait belt Activity  Tolerance: Patient tolerated treatment well;Patient limited by fatigue Patient left: in bed;with call bell/phone within reach Nurse Communication: Mobility status  GP     Matilyn Fehrman 10/09/2013, 1:10 PM

## 2013-10-09 NOTE — Progress Notes (Addendum)
[facility] PROGRESS NOTE  Kendra Snyder [facility] ZOX:[id number] DOB: 11-13-1934 DOA: 10/08/2013 PCP: [name], MD  Assessment/Plan  Multiple subcentimeter acute right MCA territory strokes, likely embolic and likely source of syncopal episode yesterday.  Has some disorganized thinking and LLE deficits which may be affected by her recent hip replacement -  Consider MRA -  ECHO done 09/29/13 without mention of PFO, and normal AV and MV.  Did have some pulmonary HTN witth moderate dilation of the RV  -  Carotid duplex:  Prelim negative for significant stenosis -  Continue telemetry - may need outpatient monitoring -  Neurology consultation -  Start daily aspirin -  Identify risk factors:   Lipid panel and A1c -  Start BP control: lisinopril 5mg  daily -  PT/OT consultations -  EEG already completed, report pending  Right hip fracture s/p ORIF -  PT/OT -  F/u orthopedics soon -  WBAT  Elevated BP, possible HTN -  Start lisinopril  Grade 1 DD, appears euvolemic  Hypokalemia, oral KCl repletion -  Avoid potassium-wasting BP medications if possible  Normocytic anemia, likely due to blood loss from recent surgery -  Hemoglobin drifting down -  Repeat CBC in AM  Diet:  regular Access:  PIV IVF:  off Proph:  heparin  Code Status: full Family Communication: patient alone.  Attempted to call daughter, but no answer this evening.   Disposition Plan: pending completion of stroke work up.  Will need 24 hour supervision, likely SNF for ongoing rehab.     Consultants:  Neurology  Procedures:  MRI brain  Antibiotics:  none   HPI/Subjective:  States she feels well.  Denies focal numbness or weakness except for her left leg since her surgery.  Somewhat tangiential, difficulty answering questions.    Objective: Filed Vitals:   10/08/13 1603 10/08/13 1619 10/08/13 2228 10/09/13 0700  BP: 149/60 143/49 151/70 149/74  Pulse: 83 84 81 70  Temp:  97.3 F (36.3 C) 99.8 F  (37.7 C) 97.7 F (36.5 C)  TempSrc:   Oral Oral  Resp: 16 18 18 18   Height:  4\' 9"  (1.448 m)    Weight:  52.4 kg (115 lb 8.3 oz)    SpO2: 98% 97% 97% 98%    Intake/Output Summary (Last 24 hours) at 10/09/13 1215 Last data filed at 10/09/13 0700  Gross per 24 hour  Intake   1460 ml  Output   1900 ml  Net   -440 ml   Filed Weights   10/08/13 1619  Weight: 52.4 kg (115 lb 8.3 oz)    Exam:   General:  CF, No acute distress  HEENT:  NCAT, MMM  Cardiovascular:  RRR, nl S1, S2 no mrg, 2+ pulses, warm extremities  Respiratory:  CTAB, no increased WOB  Abdomen:   NABS, soft, NT/ND  MSK:   Normal tone and bulk, no LEE  Neuro:  CN II-XII grossly intact, strength 5/5 BUE and LLE, 3/5 dorsiflexion left foot.  No obvious dysmetria hands, no hemineglect, sensation intact ot light touch  Skin:  Dressing left anterior shin c/d/i  Data Reviewed: Basic Metabolic Panel:  Recent Labs Lab 10/04/13 0403 10/05/13 0408 10/08/13 1146 10/09/13 0520  NA 135* 137 140 141  K 5.1 4.3 3.3* 3.2*  CL 98 100 99 102  CO2 27 28 27 29   GLUCOSE 204* 128* 110* 97  BUN 17 15 12 10   CREATININE 0.55 0.47* 0.43* 0.42*  CALCIUM 8.9 8.9 9.1 8.7  Liver Function Tests:  Recent Labs Lab 10/08/13 1146 10/09/13 0520  AST 19 18  ALT 24 23  ALKPHOS 113 108  BILITOT 0.7 0.6  PROT 5.9* 5.5*  ALBUMIN 2.9* 2.7*   No results found for this basename: LIPASE, AMYLASE,  in the last 168 hours No results found for this basename: AMMONIA,  in the last 168 hours CBC:  Recent Labs Lab 10/04/13 0403 10/05/13 0408 10/08/13 1146 10/08/13 1243 10/09/13 0520  WBC 8.3 10.5 6.1 5.6 7.1  NEUTROABS  --   --   --  4.1  --   HGB 10.8* 10.9* 11.7* 12.0 10.7*  HCT 31.4* 32.9* 34.1* 34.9* 32.1*  MCV 91.0 91.9 89.3 90.4 91.2  PLT 278 279 415* 379 370   Cardiac Enzymes: No results found for this basename: CKTOTAL, CKMB, CKMBINDEX, TROPONINI,  in the last 168 hours BNP (last 3 results)  Recent Labs   09/29/13 0424  PROBNP 308.0   CBG:  Recent Labs Lab 10/08/13 1122  GLUCAP 106*    Recent Results (from the past 240 hour(s))  URINE CULTURE     Status: None   Collection Time    09/30/13 11:49 AM      Result Value Ref Range Status   Specimen Description URINE, RANDOM   Final   Special Requests NONE   Final   Culture  Setup Time     Final   Value: 09/30/2013 18:12     Performed at [facility]   Colony Count     Final   Value: NO GROWTH     Performed at [facility]   Culture     Final   Value: NO GROWTH     Performed at [facility]   Report Status 10/01/2013 FINAL   Final  SURGICAL PCR SCREEN     Status: None   Collection Time    10/03/13  5:50 AM      Result Value Ref Range Status   MRSA, PCR NEGATIVE  NEGATIVE Final   Staphylococcus aureus NEGATIVE  NEGATIVE Final   Comment:            The Xpert SA Assay (FDA     approved for NASAL specimens     in patients over 77 years of age),     is one component of     a comprehensive surveillance     program.  Test performance has     been validated by [facility] for patients greater     than or equal to 77 year old.     It is not intended     to diagnose infection nor to     guide or monitor treatment.     Studies: Dg Chest 2 View  10/08/2013   CLINICAL DATA:  Altered mental status. Fever. Urinary tract infection.  EXAM: CHEST  2 VIEW  COMPARISON:  01/27/2009  FINDINGS: Linear opacity in the retrocardiac lung. No consolidation. No edema, significant effusion, or pneumothorax. Normal heart size. Senescent osseous changes.  IMPRESSION: Mild retrocardiac atelectasis.   Electronically Signed   By: Tiburcio PeaJonathan  Watts M.D.   On: 10/08/2013 13:02   Ct Head Wo Contrast  10/08/2013   CLINICAL DATA:  Altered mental status.  Unresponsive patient.  EXAM: CT HEAD WITHOUT CONTRAST  TECHNIQUE: Contiguous axial images were obtained from the base of the skull through the vertex without intravenous contrast.   COMPARISON:  CT HEAD W/O CM dated  09/28/2013  FINDINGS: No mass lesion, mass effect, midline shift, hydrocephalus, hemorrhage. No territorial ischemia or acute infarction. Calvarium intact. Paranasal sinuses are within normal limits.  IMPRESSION: No acute intracranial abnormality.  Age-appropriate atrophy.   Electronically Signed   By: Andreas Newport M.D.   On: 10/08/2013 12:44   Mr Brain Wo Contrast  10/08/2013   CLINICAL DATA Recent discharge for gastrointestinal bleeding and hip fracture. Altered mental status. Evaluate for possible stroke.  EXAM MRI HEAD WITHOUT CONTRAST  TECHNIQUE Multiplanar, multiecho pulse sequences of the brain and surrounding structures were obtained without intravenous contrast.  COMPARISON CT HEAD W/O CM dated 10/08/2013; CT HEAD W/O CM dated 08/16/2010; CT HEAD W/O CM dated 09/28/2013  FINDINGS Multifocal, subcentimeter, areas of acute infarction affect the periventricular and subcortical white matter on the right involving the posterior insular region extending to the centrum semiovale. There is no hemorrhage or mass effect. No cortical infarct is seen.  Moderate cerebral and cerebellar atrophy. Mild to moderate T2 and FLAIR hyperintensities consistent with chronic microvascular ischemic change. Flow voids are maintained in the carotid and basilar arteries. Both vertebrals are also patent. Partial empty sella. Mild cervical spondylosis. No osseous lesions. No acute sinus or mastoid disease.  Compared with prior CT studies, these infarcts are not visible.  IMPRESSION Multifocal subcentimeter areas of acute deep white matter infarction involving the right MCA lenticulostriate territory. See discussion above. No hemorrhage or significant mass effect.  Moderate atrophy with chronic microvascular ischemic change.  SIGNATURE  Electronically Signed   By: Davonna Belling M.D.   On: 10/08/2013 18:47    Scheduled Meds: . aspirin EC  325 mg Oral Daily  . heparin  5,000 Units Subcutaneous 3  times per day   Continuous Infusions: . sodium chloride 75 mL/hr at 10/09/13 0200    Active Problems:   Altered mental status   Unresponsive    Time spent: 30 min    Kendra Snyder, [facility]  [facility] Pager [phone]. If 7PM-7AM, please contact night-coverage at [email or web address], password [facility] 10/09/2013, 12:15 PM  LOS: 1 day

## 2013-10-09 NOTE — Progress Notes (Signed)
OT Cancellation Note  Patient Details Name: Kendra Snyder MRN: [id number] DOB: 11/05/1934   Cancelled Treatment:    Reason Eval/Treat Not Completed: Patient at procedure or test/ unavailable. Pt having EEG. Will reattempt.  From chart review it does not look like pt will have the support to go home alone.  Alba CoryREDDING, Jaquaya Coyle D 10/09/2013, 10:55 AM

## 2013-10-09 NOTE — Progress Notes (Signed)
Bilateral carotid artery duplex:  1-39% ICA stenosis.  Vertebral artery flow is antegrade.     

## 2013-10-09 NOTE — Procedures (Signed)
ELECTROENCEPHALOGRAM REPORT   Patient: Kendra Snyder       Room #: [id number] EEG No. ID: [id number] Age: 79 y.o.        Sex: female Referring Physician: Short Report Date:  10/09/2013        Interpreting Physician: Thana FarrEYNOLDS,Ayleen Mckinstry D  History: Kendra Snyder is an 79 y.o. female s/p fall   Medications:  Scheduled: . aspirin EC  325 mg Oral Daily  . heparin  5,000 Units Subcutaneous 3 times per day  . lisinopril  5 mg Oral Daily    Conditions of Recording:  This is a 16 channel EEG carried out with the patient in the awake, drowsy and asleep states.  Description:  The patient is awake for only brief periods of the tracing.  During this time the waking background activity consists of a low voltage, symmetrical, fairly well organized, 10 Hz alpha activity, seen from the parieto-occipital and posterior temporal regions.  Low voltage fast activity, poorly organized, is seen anteriorly and is at times superimposed on more posterior regions.  A mixture of theta and alpha rhythms are seen from the central and temporal regions. The patient drowses with slowing to irregular, low voltage theta and beta activity.   The patient goes in to a light sleep for the majority of the tracing with symmetrical sleep spindles, vertex central sharp transients and irregular slow activity.   Hyperventilation and intermittent photic stimulation were not performed.  IMPRESSION: Normal electroencephalogram, awake, asleep and with activation procedures. There are no focal lateralizing or epileptiform features.   Thana FarrLeslie Antonieta Slaven, MD [facility] [phone] 10/09/2013, 5:44 PM

## 2013-10-09 NOTE — Consult Note (Signed)
Referring Physician: Short    Chief Complaint: stroke  HPI:                                                                                                                                         Kendra Snyder is an 79 y.o. female with history of GI bleed who was recently admitted for hip fracture, s/p hip replacement on 3/5. Patient was doing well and went to a SNF for rehab.  She was readmitted after patient was noted to have a sudden period of unresponsives, then "She slowly became more arousable over approx 20 minutes" followed by confusion but AOX3 on arrival to ED per notes. There is no mention of seizure like activity. When discussing event with patient she has no recollection of event and cannot give me a detailed history.  She is focused on her mouth pain and then becomes labile.  When specifically asked about the hot flash prior to even she again cannot recall event. She states her left side does not feel weaker or changed since admission.   Further evaluation of patient revealed a stroke in the right lenticulostriate distribution.   UA shows amber, cloudy, mod leukocytes--UC pending Initial CT head negative MRI shows right MCA lenticulostriate territory infarcts.  Bilateral carotid artery duplex: 1-39% ICA stenosis. Vertebral artery flow is antegrade.  BP range (144-169/ 49-75)  Date last known well: Date: 10/08/2013 Time last known well: Unable to determine tPA Given: No: recent surgery and out of the window  Past Medical History  Diagnosis Date  . Renal disorder   . Blood transfusion   . Osteomyelitis   . Arthritis     Past Surgical History  Procedure Laterality Date  . Bladder surgery    . Total hip arthroplasty    . Tubal ligation    . Eye surgery    . Tonsillectomy    . Total hip arthroplasty Left 10/03/2013    Procedure: TOTAL LEFT HIP ARTHROPLASTY ANTERIOR APPROACH;  Surgeon: Shelda Pal, MD;  Location: [facility];  Service: Orthopedics;  Laterality: Left;  .  Joint replacement      Family History  Problem Relation Age of Onset  . Hypertension Mother   . Hypertension Father    Social History:  reports that she has never smoked. She has never used smokeless tobacco. She reports that she does not drink alcohol or use illicit drugs.  Allergies:  Allergies  Allergen Reactions  . Other Shortness Of Breath and Palpitations    Patient is allergic to all antibiotics  She states she has to be tested before receiving antibiotics   . Amoxicillin   . Iohexol      Desc: POSS IV CONTRAST ALLERGY NOTED ON CT A/P FROM 2005. UNABLE TO DEFINITELY CONFIRM W/ PT, PT'S FRIEND OR OFFICE. DONE W/O IV PER RAD ON 08/16/10, JB, Onset Date: [id number]   .  Penicillins     Medications:                                                                                                                           Prior to Admission:  Prescriptions prior to admission  Medication Sig Dispense Refill  . aspirin EC 325 MG tablet Take 1 tablet (325 mg total) by mouth 2 (two) times daily. Take for 4 weeks.  60 tablet  0  . docusate sodium 100 MG CAPS Take 100 mg by mouth daily.  10 capsule  0  . ferrous sulfate 325 (65 FE) MG tablet Take 1 tablet (325 mg total) by mouth 3 (three) times daily after meals.    3  . furosemide (LASIX) 20 MG tablet Take 1 tablet (20 mg total) by mouth daily.  30 tablet  0  . ondansetron (ZOFRAN) 4 MG tablet Take 1 tablet (4 mg total) by mouth every 6 (six) hours as needed for nausea.  20 tablet  0  . oxyCODONE-acetaminophen (PERCOCET/ROXICET) 5-325 MG per tablet Take 1 tablet by mouth every 4 (four) hours as needed for severe pain (For hip joint replacement).      . polyethylene glycol (MIRALAX / GLYCOLAX) packet Take 17 g by mouth daily.  14 each  0  . tiZANidine (ZANAFLEX) 4 MG tablet Take 4 mg by mouth every 6 (six) hours as needed for muscle spasms.       Scheduled: . aspirin EC  325 mg Oral Daily  . heparin  5,000 Units Subcutaneous 3 times per  day   Continuous:   ROS:                                                                                                                                       History obtained from the patient  General ROS: negative for - chills, fatigue, fever, night sweats, weight gain or weight loss Psychological ROS: negative for - behavioral disorder, hallucinations, memory difficulties, mood swings or suicidal ideation Ophthalmic ROS: negative for - blurry vision, double vision, eye pain or loss of vision ENT ROS: negative for - epistaxis, nasal discharge, oral lesions, sore throat, tinnitus or vertigo Allergy and Immunology ROS: negative for - hives or itchy/watery eyes Hematological and Lymphatic ROS: negative for - bleeding problems, bruising or swollen lymph nodes Endocrine ROS: negative for - galactorrhea, hair pattern changes,  polydipsia/polyuria or temperature intolerance Respiratory ROS: negative for - cough, hemoptysis, shortness of breath or wheezing Cardiovascular ROS: negative for - chest pain, dyspnea on exertion, edema or irregular heartbeat Gastrointestinal ROS: negative for - abdominal pain, diarrhea, hematemesis, nausea/vomiting or stool incontinence Genito-Urinary ROS: negative for - dysuria, hematuria, incontinence or urinary frequency/urgency Musculoskeletal ROS: negative for - joint swelling or muscular weakness Neurological ROS: as noted in HPI Dermatological ROS: negative for rash and skin lesion changes  Neurologic Examination:                                                                                                      Blood pressure 149/74, pulse 70, temperature 97.7 F (36.5 C), temperature source Oral, resp. rate 18, height 4\' 9"  (1.448 m), weight 52.4 kg (115 lb 8.3 oz), SpO2 98.00%.  Mental Status: Alert, oriented to hospital and that she was bought to hosptilal due to not feeling well.  She has difficulty recall exact events and become labile when talking about  mouth pain.  She is easily redirected to conversation about her stroke. Speech is dysarthric but mouth is very dry without evidence of aphasia.  Able to follow 3 step commands without difficulty. Cranial Nerves: II: Discs flat bilaterally; Visual fields grossly normal, pupils equal, round, reactive to light and accommodation III,IV, VI: ptosis not present, extra-ocular motions intact bilaterally V,VII: smile symmetric, facial light touch sensation normal bilaterally VIII: hearing normal bilaterally IX,X: gag reflex present XI: bilateral shoulder shrug XII: midline tongue extension --tongue is beefy and red  Motor: Right : Upper extremity   5/5    Left:     Upper extremity   4/5 (note) --proximal is 4-/5 but shoulder is painful and unstable due to prior injury   Lower extremity   5/5     Lower extremity   3/5 PF>DF Tone and bulk:normal tone throughout; no atrophy noted Sensory: Pinprick and light touch intact throughout, bilaterally with DSS intact  Deep Tendon Reflexes:  Right: Upper Extremity   Left: Upper extremity   biceps (C-5 to C-6) 2/4   biceps (C-5 to C-6) 2/4 tricep (C7) 2/4    triceps (C7) 2/4 Brachioradialis (C6) 2/4  Brachioradialis (C6) 2/4  Lower Extremity Lower Extremity  quadriceps (L-2 to L-4) 2/4   quadriceps (L-2 to L-4) 2/4 Achilles (S1) 2/4   Achilles (S1) 2/4  Plantars: Right: downgoing   Left: mute Cerebellar: normal finger-to-nose,  normal heel-to-shin test on the right--unable to do on the left Gait: not tested CV: pulses palpable throughout    Lab Results: Basic Metabolic Panel:  Recent Labs Lab 10/04/13 0403 10/05/13 0408 10/08/13 1146 10/09/13 0520  NA 135* 137 140 141  K 5.1 4.3 3.3* 3.2*  CL 98 100 99 102  CO2 27 28 27 29   GLUCOSE 204* 128* 110* 97  BUN 17 15 12 10   CREATININE 0.55 0.47* 0.43* 0.42*  CALCIUM 8.9 8.9 9.1 8.7    Liver Function Tests:  Recent Labs Lab 10/08/13 1146 10/09/13 0520  AST 19 18  ALT 24 23  ALKPHOS  113 108  BILITOT 0.7 0.6  PROT 5.9* 5.5*  ALBUMIN 2.9* 2.7*   No results found for this basename: LIPASE, AMYLASE,  in the last 168 hours No results found for this basename: AMMONIA,  in the last 168 hours  CBC:  Recent Labs Lab 10/04/13 0403 10/05/13 0408 10/08/13 1146 10/08/13 1243 10/09/13 0520  WBC 8.3 10.5 6.1 5.6 7.1  NEUTROABS  --   --   --  4.1  --   HGB 10.8* 10.9* 11.7* 12.0 10.7*  HCT 31.4* 32.9* 34.1* 34.9* 32.1*  MCV 91.0 91.9 89.3 90.4 91.2  PLT 278 279 415* 379 370    Cardiac Enzymes: No results found for this basename: CKTOTAL, CKMB, CKMBINDEX, TROPONINI,  in the last 168 hours  Lipid Panel:  Recent Labs Lab 10/09/13 0520  CHOL 182  TRIG 70  HDL 54  CHOLHDL 3.4  VLDL 14  LDLCALC 161*    CBG:  Recent Labs Lab 10/08/13 1122  GLUCAP 106*    Microbiology: Results for orders placed during the hospital encounter of 09/28/13  URINE CULTURE     Status: None   Collection Time    09/30/13 11:49 AM      Result Value Ref Range Status   Specimen Description URINE, RANDOM   Final   Special Requests NONE   Final   Culture  Setup Time     Final   Value: 09/30/2013 18:12     Performed at [facility] Count     Final   Value: NO GROWTH     Performed at [facility]   Culture     Final   Value: NO GROWTH     Performed at [facility]   Report Status 10/01/2013 FINAL   Final  SURGICAL PCR SCREEN     Status: None   Collection Time    10/03/13  5:50 AM      Result Value Ref Range Status   MRSA, PCR NEGATIVE  NEGATIVE Final   Staphylococcus aureus NEGATIVE  NEGATIVE Final   Comment:            The Xpert SA Assay (FDA     approved for NASAL specimens     in patients over 58 years of age),     is one component of     a comprehensive surveillance     program.  Test performance has     been validated by [employer] for patients greater     than or equal to 30 year old.     It is not intended     to diagnose  infection nor to     guide or monitor treatment.    Coagulation Studies:  Recent Labs  10/08/13 1146  LABPROT 12.7  INR 0.97    Imaging: Dg Chest 2 View  10/08/2013   CLINICAL DATA:  Altered mental status. Fever. Urinary tract infection.  EXAM: CHEST  2 VIEW  COMPARISON:  01/27/2009  FINDINGS: Linear opacity in the retrocardiac lung. No consolidation. No edema, significant effusion, or pneumothorax. Normal heart size. Senescent osseous changes.  IMPRESSION: Mild retrocardiac atelectasis.   Electronically Signed   By: Tiburcio Pea M.D.   On: 10/08/2013 13:02   Ct Head Wo Contrast  10/08/2013   CLINICAL DATA:  Altered mental status.  Unresponsive patient.  EXAM: CT HEAD WITHOUT CONTRAST  TECHNIQUE: Contiguous axial images were obtained from the base of the  skull through the vertex without intravenous contrast.  COMPARISON:  CT HEAD W/O CM dated 09/28/2013  FINDINGS: No mass lesion, mass effect, midline shift, hydrocephalus, hemorrhage. No territorial ischemia or acute infarction. Calvarium intact. Paranasal sinuses are within normal limits.  IMPRESSION: No acute intracranial abnormality.  Age-appropriate atrophy.   Electronically Signed   By: Andreas Newport M.D.   On: 10/08/2013 12:44   Mr Brain Wo Contrast  10/08/2013   CLINICAL DATA Recent discharge for gastrointestinal bleeding and hip fracture. Altered mental status. Evaluate for possible stroke.  EXAM MRI HEAD WITHOUT CONTRAST  TECHNIQUE Multiplanar, multiecho pulse sequences of the brain and surrounding structures were obtained without intravenous contrast.  COMPARISON CT HEAD W/O CM dated 10/08/2013; CT HEAD W/O CM dated 08/16/2010; CT HEAD W/O CM dated 09/28/2013  FINDINGS Multifocal, subcentimeter, areas of acute infarction affect the periventricular and subcortical white matter on the right involving the posterior insular region extending to the centrum semiovale. There is no hemorrhage or mass effect. No cortical infarct is seen.   Moderate cerebral and cerebellar atrophy. Mild to moderate T2 and FLAIR hyperintensities consistent with chronic microvascular ischemic change. Flow voids are maintained in the carotid and basilar arteries. Both vertebrals are also patent. Partial empty sella. Mild cervical spondylosis. No osseous lesions. No acute sinus or mastoid disease.  Compared with prior CT studies, these infarcts are not visible.  IMPRESSION Multifocal subcentimeter areas of acute deep white matter infarction involving the right MCA lenticulostriate territory. See discussion above. No hemorrhage or significant mass effect.  Moderate atrophy with chronic microvascular ischemic change.  SIGNATURE  Electronically Signed   By: Davonna Belling M.D.   On: 10/08/2013 18:47    Echo on 09/29/13  Study Conclusions  - Study data: Technically adequate study. - Left ventricle: The cavity size was normal. There was mild focal basal hypertrophy without dynamic outflow tract obstruction. Systolic function was vigorous. The estimated ejection fraction was in the range of 65% to 70%. Wall motion was normal; there were no regional wall motion abnormalities. Left ventricular diastolic function parameters were normal.  A1c Pending LDL pending EEG --pending   Felicie Morn PA-C [facility] [phone]  10/09/2013, 12:46 PM  Patient seen and examined.  Clinical course and management discussed.  Necessary edits performed.  I agree with the above.  Assessment and plan of care developed and discussed below.      Assessment: 79 y.o. female presenting after a period of unresponsiveness.  MRI of the brain reviewed and shows right MCA lenticulostriate territory infarcts.  Stroke work up has included an echocardiogram and carotid doppler that were unremarkable.  Patient was on an aspirin a day prior to admission.  Currently remains on an aspirin a day.  LDL and Aic pending.    Stroke Risk Factors - none  Recommendations: 1. HgbA1c,  fasting lipid panel pending 2. PT consult, OT consult, Speech consult 3. Prophylactic therapy-Antiplatelet med: Plavix - dose 75 mg daily in place of ASA 4. Risk factor modification 5. Telemetry monitoring 6. Frequent neuro checks 7. No further neurologic intervention is recommended at this time.  If further questions arise, please call or page at that time.  Thank you for allowing neurology to participate in the care of this patient.   Thana Farr, MD [facility] [phone]  10/09/2013  4:04 PM

## 2013-10-09 NOTE — Progress Notes (Signed)
10/09/13 1007 Patient c/o drainage collecting in her mouth. Pt not able to tell where the drainage is coming from. Pt states it started five years ago after having a tooth pulled.

## 2013-10-09 NOTE — Progress Notes (Signed)
EEG Completed; Results Pending  

## 2013-10-09 NOTE — Progress Notes (Signed)
Clinical Social Work  CSW met with patient and friend at bedside and introduced myself and explained role. Patient reports she spoke with ED SW about placement needs and wants to DC home. Patient lives at home by herself and reports she has lived alone since her husband passed away about 20 years ago. Patient reports no bad reports about SNF but just feels more comfortable at home. CSW explained that PT would work with patient and evaluate needs. CSW agreeable to follow up after PT evaluation to further discuss details re: her wishes at DC. CSW completed FL2 and placed in chart.  CSW will continue to follow.  [place], [place] [phone]

## 2013-10-10 DIAGNOSIS — I6350 Cerebral infarction due to unspecified occlusion or stenosis of unspecified cerebral artery: Principal | ICD-10-CM

## 2013-10-10 DIAGNOSIS — I5032 Chronic diastolic (congestive) heart failure: Secondary | ICD-10-CM

## 2013-10-10 DIAGNOSIS — I471 Supraventricular tachycardia: Secondary | ICD-10-CM

## 2013-10-10 DIAGNOSIS — I498 Other specified cardiac arrhythmias: Secondary | ICD-10-CM

## 2013-10-10 LAB — CBC
HCT: 32 % — ABNORMAL LOW (ref 36.0–46.0)
Hemoglobin: 10.4 g/dL — ABNORMAL LOW (ref 12.0–15.0)
MCH: 30 pg (ref 26.0–34.0)
MCHC: 32.5 g/dL (ref 30.0–36.0)
MCV: 92.2 fL (ref 78.0–100.0)
PLATELETS: 376 10*3/uL (ref 150–400)
RBC: 3.47 MIL/uL — ABNORMAL LOW (ref 3.87–5.11)
RDW: 13.9 % (ref 11.5–15.5)
WBC: 5.6 10*3/uL (ref 4.0–10.5)

## 2013-10-10 LAB — BASIC METABOLIC PANEL
BUN: 12 mg/dL (ref 6–23)
CALCIUM: 8.7 mg/dL (ref 8.4–10.5)
CO2: 28 mEq/L (ref 19–32)
Chloride: 105 mEq/L (ref 96–112)
Creatinine, Ser: 0.48 mg/dL — ABNORMAL LOW (ref 0.50–1.10)
GLUCOSE: 96 mg/dL (ref 70–99)
Potassium: 3.4 mEq/L — ABNORMAL LOW (ref 3.7–5.3)
Sodium: 144 mEq/L (ref 137–147)

## 2013-10-10 LAB — LIPID PANEL
CHOL/HDL RATIO: 3.5 ratio
CHOLESTEROL: 183 mg/dL (ref 0–200)
HDL: 52 mg/dL (ref >39)
LDL Cholesterol: 114 mg/dL — ABNORMAL HIGH (ref 0–99)
Triglycerides: 86 mg/dL (ref <150)
VLDL: 17 mg/dL (ref 0–40)

## 2013-10-10 LAB — URINE CULTURE

## 2013-10-10 MED ORDER — FUROSEMIDE 20 MG PO TABS: 20.0000 mg | ORAL_TABLET | ORAL | Status: AC

## 2013-10-10 MED ORDER — CLOPIDOGREL BISULFATE 75 MG PO TABS
75.0000 mg | ORAL_TABLET | Freq: Every day | ORAL | Status: DC
  Administered 2013-10-10: 75 mg via ORAL
  Filled 2013-10-10 (×2): qty 1

## 2013-10-10 MED ORDER — CLOPIDOGREL BISULFATE 75 MG PO TABS: 75.0000 mg | ORAL_TABLET | Freq: Every day | ORAL | Status: AC

## 2013-10-10 MED ORDER — POTASSIUM CHLORIDE ER 10 MEQ PO TBCR: 10.0000 meq | EXTENDED_RELEASE_TABLET | Freq: Every day | ORAL | Status: AC

## 2013-10-10 MED ORDER — ATORVASTATIN CALCIUM 20 MG PO TABS
20.0000 mg | ORAL_TABLET | Freq: Every day | ORAL | Status: DC
  Filled 2013-10-10: qty 1

## 2013-10-10 MED ORDER — POLYETHYLENE GLYCOL 3350 17 G PO PACK: 17.0000 g | PACK | Freq: Every day | ORAL | Status: AC | PRN

## 2013-10-10 MED ORDER — POTASSIUM CHLORIDE CRYS ER 20 MEQ PO TBCR
40.0000 meq | EXTENDED_RELEASE_TABLET | Freq: Once | ORAL | Status: AC
  Administered 2013-10-10: 40 meq via ORAL
  Filled 2013-10-10: qty 2

## 2013-10-10 MED ORDER — ATORVASTATIN CALCIUM 20 MG PO TABS: 20.0000 mg | ORAL_TABLET | Freq: Every day | ORAL | Status: AC

## 2013-10-10 MED ORDER — LISINOPRIL 5 MG PO TABS: 5.0000 mg | ORAL_TABLET | Freq: Every day | ORAL | Status: AC

## 2013-10-10 NOTE — Progress Notes (Signed)
Occupational Therapy Treatment Patient Details Name: Kendra Snyder MRN: [id number] DOB: 11/18/1934 Today's Date: 10/10/2013 Time: 1019-1100 OT Time Calculation (min): 41 min  OT Assessment / Plan / Recommendation  History of present illness Pt presenting from SNF with episode of altered mental status.  Per chart review and EMS patient was unresponsive this morning to sternal rub, voice, pain.  She slowly became more arousable over approx 20 minutes.  Pt was admitted to SNF 2 days ago after hip replacement.   Upon arrival to the ED patient is oriented x 3.  States she does not remember the event this morning.  Remembers staff waking her up and calling her name.  States she felt that she was "far away".  Pt found to have multifocal subcentimeter areas of acute deep white matter infarction   OT comments  Pt making progress with functional transfers but do notice some cognitive difficulties during ADL tasks. Feel she will benefit from SNF at d/c to increase safety and independence with self care tasks. Will continue to follow on acute.   Follow Up Recommendations  SNF;Supervision/Assistance - 24 hour    Barriers to Discharge       Equipment Recommendations  3 in 1 bedside comode    Recommendations for Other Services    Frequency Min 2X/week   Progress towards OT Goals Progress towards OT goals: Progressing toward goals  Plan Discharge plan remains appropriate    Precautions / Restrictions Precautions Precautions: Fall Restrictions Weight Bearing Restrictions: No Other Position/Activity Restrictions: WBAT   Pertinent Vitals/Pain No complaint of    ADL  Lower Body Dressing: Performed;Minimal assistance (with sock aid to don socks) Where Assessed - Lower Body Dressing: Unsupported sitting Toilet Transfer: Performed;Minimal assistance;Moderate assistance Toilet Transfer Method: [facility] Toilet Transfer Equipment: Bedside commode Toileting - Clothing  Manipulation and Hygiene: Performed;Minimal assistance Where Assessed - Toileting Clothing Manipulation and Hygiene: Sit on 3-in-1 or toilet Equipment Used: Gait belt;Rolling walker;Sock aid ADL Comments: Pt able to reach to posterior periarea while sitting on BSC from the front. Assisted pt for thoroughness to ensure clean after large BM. Pt needed mod cues for functional transfers especially for direction to turn the walker and hand placement. Noted pt to have some difficulty with problem solving but with increased time figured out tasks. (ie tried to don both socks with sock aid  on the same foot). She needed slightly increased time to name tomato on her food tray. She requires increased time for tasks. She was able to tell me that she came from SNF PTA.     OT Diagnosis:    OT Problem List:   OT Treatment Interventions:     OT Goals(current goals can now be found in the care plan section) ADL Goals Pt Will Perform Grooming: with supervision;standing Pt Will Perform Upper Body Dressing: with supervision Pt Will Perform Lower Body Dressing: with supervision;sit to/from stand Pt Will Transfer to Toilet: with supervision;ambulating;regular height toilet Pt Will Perform Toileting - Clothing Manipulation and hygiene: with supervision;sit to/from stand  Visit Information  Last OT Received On: 10/10/13 Assistance Needed: +1 History of Present Illness: Pt presenting from SNF with episode of altered mental status.  Per chart review and EMS patient was unresponsive this morning to sternal rub, voice, pain.  She slowly became more arousable over approx 20 minutes.  Pt was admitted to SNF 2 days ago after hip replacement.   Upon arrival to the ED patient is oriented x  3.  States she does not remember the event this morning.  Remembers staff waking her up and calling her name.  States she felt that she was "far away".  Pt found to have multifocal subcentimeter areas of acute deep white matter infarction     Subjective Data      Prior Functioning       Cognition  Cognition Arousal/Alertness: Awake/alert Behavior During Therapy: WFL for tasks assessed/performed Area of Impairment: Problem solving;Safety/judgement Safety/Judgement: Decreased awareness of safety Awareness: Anticipatory Problem Solving: Slow processing General Comments: difficulty problem solving which direction to turn with walker to back up to recliner. Attempted to don both socks on same foot but able to self correct.    Mobility  Bed Mobility Overal bed mobility: Needs Assistance Bed Mobility: Supine to Sit Supine to sit: Min guard General bed mobility comments: pt used UE to help guide L LE over to EOB. Transfers Overall transfer level: Needs assistance Equipment used: Rolling walker (2 wheeled) Transfers: Sit to/from Stand Sit to Stand: Min assist Stand pivot transfers: Min assist;Mod assist General transfer comment: verbal cues for hand placement and assist as pt tries to sit prematurely before reaching back for chair.    Exercises      Balance    End of Session OT - End of Session Equipment Utilized During Treatment: Gait belt;Rolling walker Activity Tolerance: Patient tolerated treatment well Patient left: in chair;with chair alarm set;with call bell/phone within reach  GO     Lennox Laity [phone] 10/10/2013, 11:24 AM

## 2013-10-10 NOTE — Progress Notes (Signed)
Physical Therapy Treatment Patient Details Name: Kendra ApplebaumJulia M Rubinstein MRN: [id number] DOB: 11/25/1934 Today's Date: 10/10/2013 Time: [id number] PT Time Calculation (min): 36 min  PT Assessment / Plan / Recommendation  History of Present Illness Pt presenting from SNF with episode of altered mental status.  Per chart review and EMS patient was unresponsive this morning to sternal rub, voice, pain.  She slowly became more arousable over approx 20 minutes.  Pt was admitted to SNF 2 days ago after hip replacement.   Upon arrival to the ED patient is oriented x 3.  States she does not remember the event this morning.  Remembers staff waking her up and calling her name.  States she felt that she was "far away".  Pt found to have multifocal subcentimeter areas of acute deep white matter infarction   PT Comments   Pt cooperative and progressing but continues unsteady with transfers and ambulation and with questionable safety awareness.  Follow Up Recommendations  SNF     Does the patient have the potential to tolerate intense rehabilitation     Barriers to Discharge        Equipment Recommendations  None recommended by PT    Recommendations for Other Services OT consult  Frequency Min 6X/week   Progress towards PT Goals Progress towards PT goals: Progressing toward goals  Plan Current plan remains appropriate    Precautions / Restrictions Precautions Precautions: Fall Restrictions Weight Bearing Restrictions: No Other Position/Activity Restrictions: WBAT   Pertinent Vitals/Pain Min c/o pain    Mobility  Bed Mobility Overal bed mobility: Needs Assistance Bed Mobility: Supine to Sit Supine to sit: Min guard General bed mobility comments: pt used UE to help guide L LE over to EOB. Transfers Overall transfer level: Needs assistance Equipment used: Rolling walker (2 wheeled) Transfers: Sit to/from Stand Sit to Stand: Min assist Stand pivot transfers: Min assist;Mod assist General  transfer comment: cues for transition position, LE management and use of UEs to self assist Ambulation/Gait Ambulation/Gait assistance: Min assist Ambulation Distance (Feet): 95 Feet Assistive device: Rolling walker (2 wheeled) Gait Pattern/deviations: Step-to pattern;Step-through pattern;Shuffle;Decreased step length - right;Decreased step length - left;Trunk flexed General Gait Details: cues for posture, position from RW, increased BOS and ER on R,  and initial sequence    Exercises Total Joint Exercises Ankle Circles/Pumps: AROM;Both;20 reps;Supine Quad Sets: AROM;Both;10 reps;Supine Heel Slides: AAROM;Supine;Left;20 reps Hip ABduction/ADduction: AAROM;Left;15 reps;Supine   PT Diagnosis:    PT Problem List:   PT Treatment Interventions:     PT Goals (current goals can now be found in the care plan section) Acute Rehab PT Goals Patient Stated Goal: get back to being independent PT Goal Formulation: With patient Time For Goal Achievement: 10/12/13 Potential to Achieve Goals: Fair  Visit Information  Last PT Received On: 10/10/13 Assistance Needed: +1 History of Present Illness: Pt presenting from SNF with episode of altered mental status.  Per chart review and EMS patient was unresponsive this morning to sternal rub, voice, pain.  She slowly became more arousable over approx 20 minutes.  Pt was admitted to SNF 2 days ago after hip replacement.   Upon arrival to the ED patient is oriented x 3.  States she does not remember the event this morning.  Remembers staff waking her up and calling her name.  States she felt that she was "far away".  Pt found to have multifocal subcentimeter areas of acute deep white matter infarction    Subjective Data  Subjective: They said I  had a stroke.  Now I know what I am dealing with Patient Stated Goal: get back to being independent   Cognition  Cognition Arousal/Alertness: Awake/alert Behavior During Therapy: WFL for tasks  assessed/performed Overall Cognitive Status: Within Functional Limits for tasks assessed Area of Impairment: Problem solving;Safety/judgement Safety/Judgement: Decreased awareness of safety Awareness: Anticipatory Problem Solving: Slow processing General Comments: difficulty problem solving which direction to turn with walker to back up to recliner. Attempted to don both socks on same foot but able to self correct.    Balance     End of Session PT - End of Session Equipment Utilized During Treatment: Gait belt Activity Tolerance: Patient tolerated treatment well;Patient limited by fatigue Patient left: with call bell/phone within reach;in chair Nurse Communication: Mobility status   GP     Mckena Chern 10/10/2013, 12:37 PM

## 2013-10-10 NOTE — Progress Notes (Signed)
Clinical Social Work  CSW faxed DC summary to [facility] who is agreeable to accept patient today. CSW prepared DC packet with FL2 included. CSW informed patient and dtr of DC plans. Patient is upset that she has to return to SNF but reports she will go to SNF [facility] until she can return home. Patient feels she has lost her independence but understands that her family and hospital feel that SNF is best placement for patient. RN to call report. CSW coordinated transportation via [facility]. Request #: [id number].   CSW is signing off but available if needed.  [place]olly Jayston Trevino, [place] [phone]

## 2013-10-10 NOTE — Progress Notes (Signed)
Gave report to [place], [facility]. Left number if she had additional questions.

## 2013-10-10 NOTE — Progress Notes (Signed)
Clinical Social Work  CSW reviewed chart which stated that PT recommends SNF at DC. CSW met with patient in order to discuss DC plans. Patient appears somewhat confused. Patient believes that she admitted to the hospital from home and denies that she was at [facility]. Patient becomes guarded and reports she does not know how long she has been at hospital but just wants to DC home. CSW inquired about how patient would manage at home but patient reports that she does not have to answer those questions. Patient withdrawn and reports, "I don't like where this conversation is going. I just want to go home and you are pulling me away from home." CSW explained CSW role to ensure safe DC plans and explained PT/OT recommendations along with MD recommendations for SNF placement. Patient reports no problems with [facility] but does feel more comfortable at home. Patient agreeable for CSW to contact dtr.  CSW called dtr who lives in [place] and explained [place] plans. Dtr reports she had a long conversation with patient last night about returning to SNF. Dtr reports limited support at home and feels if patient can rehab then she would be able to return home. Dtr reports patient is mad at her about encouraging SNF placement and reports that patient stated dtr didn't even have to visit her. Dtr agreeable that SNF placement is best for patient at [facility].  Per progression meeting, MD reports patient might be medically stable after ST evaluation. CSW contacted SNF who is agreeable to accept. CSW faxed PT/OT notes for insurance authorization. CSW will continue to follow.  [place], [place] [phone]

## 2013-10-10 NOTE — Evaluation (Signed)
Speech Language Pathology Evaluation Patient Details Name: Kendra Snyder MRN: [id number] DOB: 10/07/1934 Today's Date: 10/10/2013 Time:  -     Problem List:  Patient Active Problem List   Diagnosis Date Noted  . SVT (supraventricular tachycardia) 10/10/2013  . Chronic diastolic heart failure 10/10/2013  . CVA (cerebral infarction) 10/09/2013  . Black stools 10/08/2013  . Acute blood loss anemia 10/08/2013  . Altered mental status 10/08/2013  . Unresponsive 10/08/2013  . S/P left THA 10/03/2013  . Left hip pain 09/30/2013  . Osteoarthritis of left hip 09/30/2013  . Fall 09/29/2013  . Generalized weakness 09/29/2013  . Hypokalemia 09/29/2013  . UTI (lower urinary tract infection) 09/28/2013  . Bilateral leg edema 09/28/2013  . FOOT PAIN 08/29/2008   Past Medical History:  Past Medical History  Diagnosis Date  . Renal disorder   . Blood transfusion   . Osteomyelitis   . Arthritis    Past Surgical History:  Past Surgical History  Procedure Laterality Date  . Bladder surgery    . Total hip arthroplasty    . Tubal ligation    . Eye surgery    . Tonsillectomy    . Total hip arthroplasty Left 10/03/2013    Procedure: TOTAL LEFT HIP ARTHROPLASTY ANTERIOR APPROACH;  Surgeon: Shelda PalMatthew D Olin, MD;  Location: [facility] ORS;  Service: Orthopedics;  Laterality: Left;  . Joint replacement     HPI:  Kendra Snyder is an 79 y.o. female with history of GI bleed who was recently admitted for hip fracture, s/p hip replacement on 3/5. Patient was doing well and went to a SNF for rehab. She was readmitted after patient was noted to have a sudden period of unresponsives, then "She slowly became more arousable over approx 20 minutes" followed by confusion but AOX3 on arrival to ED per notes. There is no mention of seizure like activity.  Further evaluation of patient revealed a stroke in the right lenticulostriate distribution.    Assessment / Plan / Recommendation Clinical Impression  Patient presents with cognitive impairements, primarily in the areas of attention, focus, and concentration, impacting overall fluency of speech at the conversation level, problem solving, reasoning, and decision making. patient with poor anticipatory awareness of needs after d/c despite verbalization of borderline intellectual awareness of cognitive decline  which based on conversation, appears to have been occurring over the past few years. Therapuetic intervention including cueing for carryover of information ineffective. Question if possible baseline cognitive deficits now exacerbated by acute CVA or if patient is at baseline cognitively. Recommend 24 hour supervision at least initially to see if cognition clears. Recommend SLP f/u if plan is for SNF as well as potential neuro consult for possible baseline cognitive deficits. Will defer f/u to SNF as it seems based on MD discussion that this is the plan.     SLP Assessment  All further Speech Lanaguage Pathology  needs can be addressed in the next venue of care    Follow Up Recommendations  Skilled Nursing facility       Pertinent Vitals/Pain n/a   SLP Goals  SLP Goals Progress/Goals/Alternative treatment plan discussed with pt/caregiver and they: Disagree  SLP Evaluation Prior Functioning  Cognitive/Linguistic Baseline: Baseline deficits Baseline deficit details: per patient, decline in cognitive function for a "few years" Type of Home: Apartment   Cognition  Overall Cognitive Status: Impaired/Different from baseline (unclear if different from baseline) Arousal/Alertness: Awake/alert Orientation Level: Oriented to person;Oriented to place;Oriented to time (moderate cues for situation) Attention:  Sustained Sustained Attention: Impaired Sustained Attention Impairment: Verbal complex;Functional complex Memory: Impaired Memory Impairment: Decreased recall of new information;Decreased short term memory Decreased Short Term Memory:  Verbal complex;Functional complex;Verbal basic Awareness: Impaired Awareness Impairment: Intellectual impairment Problem Solving: Impaired Problem Solving Impairment: Verbal complex;Functional complex Executive Function: Reasoning;Decision Making Reasoning: Impaired Reasoning Impairment: Verbal complex;Functional complex Decision Making: Impaired Decision Making Impairment: Verbal complex;Functional complex Behaviors: Impulsive Safety/Judgment: Impaired Comments: decreased safety awareness    Comprehension  Auditory Comprehension Overall Auditory Comprehension: Impaired Yes/No Questions: Within Functional Limits Commands: Impaired Multistep Basic Commands: 75-100% accurate Conversation: Complex Other Conversation Comments: impaired Interfering Components: Attention EffectiveTechniques: Visual/Gestural cues;Stressing words;Repetition Visual Recognition/Discrimination Discrimination: Within Function Limits Reading Comprehension Reading Status: Not tested (due to visual impairements)    Expression Expression Primary Mode of Expression: Verbal Verbal Expression Overall Verbal Expression: Impaired at baseline Initiation: No impairment Level of Generative/Spontaneous Verbalization: Conversation Repetition: No impairment Naming: No impairment Pragmatics: No impairment;Impairment Impairments: Abnormal affect;Topic maintenance Interfering Components: Attention Written Expression Written Expression: Not tested   Oral / Motor Oral Motor/Sensory Function Overall Oral Motor/Sensory Function: Appears within functional limits for tasks assessed Motor Speech Overall Motor Speech: Appears within functional limits for tasks assessed   GO   Ferdinand Lango MA, CCC-SLP [phone]   Kendra Snyder 10/10/2013, 1:35 PM

## 2013-10-10 NOTE — Discharge Summary (Signed)
Physician Discharge Summary  Kendra PouchJulia M [facility] FAO:[id number] DOB: 08/25/1934 DOA: 10/08/2013  PCP: Sonda PrimesAlex Plotnikov, MD  Admit date: 10/08/2013 Discharge date: 10/10/2013  Recommendations for Outpatient Follow-up:  1. Follow up with Dr. Pearlean Snyder, Neurology, in 1 month.  Please assist patient with scheduling appointment and transportation to and from visit.  2. [facility] for follow up hip replacement 3. Continue TED hose during the day, off at night 4. PCP for repeat BMP and CBC in 1 week.  Blood pressure check in 1 week.  Please perform MMSE and consider work up for early dementia.   5. Daily weights 6. Cardiology follow up for outpatient telemetry, [place] to contact patient for information about follow up after discharge.   7. Ongoing PT/OT/speech therapy  Discharge Diagnoses:  Principal Problem:   CVA (cerebral infarction) Active Problems:   Hypokalemia   Acute blood loss anemia   Altered mental status   Unresponsive   SVT (supraventricular tachycardia)   Chronic diastolic heart failure   Discharge Condition: stable, improved  Diet recommendation: low sodium  Wt Readings from Last 3 Encounters:  10/08/13 52.4 kg (115 lb 8.3 oz)  09/28/13 56.019 kg (123 lb 8 oz)  09/28/13 56.019 kg (123 lb 8 oz)    History of present illness:  Kendra ApplebaumJulia M Snyder is a 79 y.o. female  With a hx of prior GI bleed, depression, and osteoarthritis who was recently admitted for hip fracture, s/p hip replacement on 3/5. The patient was discharged to SNF for rehab. On the AM of admission, staff reported the patient to be suddenly unresponsive while laying in bed. No seizure-like activity was noted. The patient was noted to have confusion that gradually subsided by the time of her ED visit. Head CT was unremarkable. Given concerns of unresponsiveness, the hospitalist was consulted for admission.   On further questioning, the patient reported feeling increasingly  "hot" just prior to the event and recalled "wetting myself." Denies post-ictal symptoms.    Hospital Course:   Multiple subcentimeter acute right MCA territory strokes, likely embolic and likely source of syncopal episode. Has some disorganized thinking and LLE deficits which may be affected by her recent hip replacement.  She was seen by Neurology who assisted with evaluation.   She did not undergo MRA at this time.  ECHO done 09/29/13 without mention of PFO, and normal AV and MV. Did have some pulmonary HTN witth moderate dilation of the RV.  Carotid duplex: Prelim negative for significant stenosis.  Telemetry demonstrated occasional sinus tachycardia but no atrial fibrillation or flutter.  Her lipid panel is listed below and she was started on atorvastatin.  Her A1c was below diabetic range.  Her blood pressures were somewhat elevated so she was started on lisinopril.  EEG demonstrated no evidence of epileptiform activity.  She was seen by PT/OT who recommended ongoing therapy.  Speech therapy recommended ongoing therapy.  She had previously been on ASA 325mg  BID, however, neurology recommended transitioning to plavix 75mg  daily.  She should follow up with Dr. Pearlean Snyder, Neurology, in less than 1 month for further evaluation if indicated.    Right hip fracture s/p left hip replacement by Dr. Charlann Snyder 10/03/2013.  Discussed dvt prophylaxis with Dr. Charlann Snyder who was okay with plavix once daily.  She may continue weight bearing as tolerated and follow up with orthopedics in 1 week.  Keep dressing clean/dry/intact.  Did not require pain medication or muscle relaxant during hospitalization so removed from  medication list.    Elevated BP, possible HTN  - Started lisinopril 5mg  daily -  PCP to review BP in 1 week  Chronic grade 1 diastolic heart failure, appears euvolemic.   -  Changed lasix to every other day -  Continue daily weights as before  Hypokalemia, oral KCl repletion, likely due to lasix - started  lisinopril and potassium daily  -  Repeat BMP in 1 week  Normocytic anemia, likely due to acute blood loss from recent surgery  - Hemoglobin drifting down -  Continue iron supplementation  - Repeat CBC in 1 week  Consultants:  Neurology Procedures:  MRI brain Carotid duplex EEG Antibiotics:  none    Discharge Exam: Filed Vitals:   10/10/13 0952  BP: 125/66  Pulse:   Temp:   Resp:    Filed Vitals:   10/09/13 1340 10/09/13 2119 10/10/13 0503 10/10/13 0952  BP: 101/51 122/59 132/68 125/66  Pulse: 77 78 66   Temp: 98.3 F (36.8 C) 98.8 F (37.1 C) 98.2 F (36.8 C)   TempSrc: Oral Oral Oral   Resp: 16 18 18    Height:      Weight:      SpO2: 99% 98% 99%     General: CF, No acute distress  HEENT: NCAT, MMM  Cardiovascular: RRR, nl S1, S2 no mrg, 2+ pulses, warm extremities  Respiratory: CTAB, no increased WOB  Abdomen: NABS, soft, NT/ND  MSK: Normal tone and bulk, no LEE  Neuro: CN II-XII grossly intact, strength 5/5 BUE and LLE, 3/5 dorsiflexion/plantar flexion left foot. No obvious dysmetria hands, no hemineglect, sensation intact ot light touch Skin: Dressing left anterior shin c/d/i   Discharge Instructions      Discharge Orders   Future Orders Complete By Expires   (HEART FAILURE PATIENTS) Call MD:  Anytime you have any of the following symptoms: 1) 3 pound weight gain in 24 hours or 5 pounds in 1 week 2) shortness of breath, with or without a dry hacking cough 3) swelling in the hands, feet or stomach 4) if you have to sleep on extra pillows at night in order to breathe.  As directed    Call MD for:  difficulty breathing, headache or visual disturbances  As directed    Call MD for:  extreme fatigue  As directed    Call MD for:  hives  As directed    Call MD for:  persistant dizziness or light-headedness  As directed    Call MD for:  persistant nausea and vomiting  As directed    Call MD for:  redness, tenderness, or signs of infection (pain,  swelling, redness, odor or green/yellow discharge around incision site)  As directed    Call MD for:  severe uncontrolled pain  As directed    Call MD for:  temperature >100.4  As directed    Diet - low sodium heart healthy  As directed    Discharge instructions  As directed    Comments:     You were hospitalized with a stroke.  We have tried to identify the reason why you had the stroke.  You may be having heart rhythm problems which is one of the most common causes of stroke, so you will be contacted by the local cardiology group to set up outpatient heart monitoring.  Your ultrasound of your neck was okay.  Please follow up with neurology in 1 month for further testing.  To reduce your risk of stroke,  please start taking plavix and atorvastatin.  I also started you on a blood pressure medication called lisinopril.  Your primary care doctor can review these medications with you at your next appointment.  Please follow up with Dr. Charlann Boxer in 1 week.   Driving Restrictions  As directed    Comments:     No driving or operating heavy machinery   Increase activity slowly  As directed        Medication List    STOP taking these medications       aspirin EC 325 MG tablet     oxyCODONE-acetaminophen 5-325 MG per tablet  Commonly known as:  PERCOCET/ROXICET     tiZANidine 4 MG tablet  Commonly known as:  ZANAFLEX      TAKE these medications       atorvastatin 20 MG tablet  Commonly known as:  LIPITOR  Take 1 tablet (20 mg total) by mouth daily at 6 PM.     clopidogrel 75 MG tablet  Commonly known as:  PLAVIX  Take 1 tablet (75 mg total) by mouth daily with breakfast.     DSS 100 MG Caps  Take 100 mg by mouth daily.     ferrous sulfate 325 (65 FE) MG tablet  Take 1 tablet (325 mg total) by mouth 3 (three) times daily after meals.     furosemide 20 MG tablet  Commonly known as:  LASIX  Take 1 tablet (20 mg total) by mouth every other day.     lisinopril 5 MG tablet  Commonly known  as:  PRINIVIL,ZESTRIL  Take 1 tablet (5 mg total) by mouth daily.     ondansetron 4 MG tablet  Commonly known as:  ZOFRAN  Take 1 tablet (4 mg total) by mouth every 6 (six) hours as needed for nausea.     polyethylene glycol packet  Commonly known as:  MIRALAX / GLYCOLAX  Take 17 g by mouth daily as needed for mild constipation.     potassium chloride 10 MEQ tablet  Commonly known as:  K-DUR  Take 1 tablet (10 mEq total) by mouth daily.       Follow-up Information   Follow up with [name], MD. Schedule an appointment as soon as possible for a visit in 1 week.   Specialty:  Internal Medicine   Contact information:   [address] [place] [address] [phone]       Follow up with Gates Rigg, MD. Schedule an appointment as soon as possible for a visit in 1 month.   Specialties:  Neurology, Radiology   Contact information:   [address] [place] [address] [phone]       Follow up with Shelda Pal, MD In 1 week.   Specialty:  Orthopedic Surgery   Contact information:   [address] [place] [address] [phone]       The results of significant diagnostics from this hospitalization (including imaging, microbiology, ancillary and laboratory) are listed below for reference.    Significant Diagnostic Studies: Dg Chest 2 View  10/08/2013   CLINICAL DATA:  Altered mental status. Fever. Urinary tract infection.  EXAM: CHEST  2 VIEW  COMPARISON:  01/27/2009  FINDINGS: Linear opacity in the retrocardiac lung. No consolidation. No edema, significant effusion, or pneumothorax. Normal heart size. Senescent osseous changes.  IMPRESSION: Mild retrocardiac atelectasis.   Electronically Signed   By: Audry Riles.D.  On: 10/08/2013 13:02   Dg Hip Complete Left  09/28/2013   CLINICAL DATA:  Left hip pain, fall  EXAM: LEFT HIP - COMPLETE 2+ VIEW  COMPARISON:  02/13/2009  FINDINGS: Three  views of the left hip submitted. There is diffuse osteopenia. Again noted right hip prosthesis. No definite acute fracture is identified. There is significant progression of degenerative changes. Significant narrowing of superior left hip joint space. Cystic and sclerotic changes are noted superior acetabulum. Sclerotic changes are noted superior aspect of femoral head. There is collapse and remodeling with partial impaction of superior aspect of femoral head of indeterminate age. Clinical correlation is necessary, further correlation with MRI could be performed as clinically warranted.  IMPRESSION: There is diffuse osteopenia. Again noted right hip prosthesis. No definite acute fracture is identified. There is significant progression of degenerative changes. Significant narrowing of superior left hip joint space. Cystic and sclerotic changes are noted superior acetabulum. Sclerotic changes are noted superior aspect of femoral head. There is collapse and remodeling with partial impaction of superior aspect of femoral head of indeterminate age. Clinical correlation is necessary, further correlation with MRI could be performed as clinically warranted.   Electronically Signed   By: Natasha Mead M.D.   On: 09/28/2013 09:07   Ct Head Wo Contrast  10/08/2013   CLINICAL DATA:  Altered mental status.  Unresponsive patient.  EXAM: CT HEAD WITHOUT CONTRAST  TECHNIQUE: Contiguous axial images were obtained from the base of the skull through the vertex without intravenous contrast.  COMPARISON:  CT HEAD W/O CM dated 09/28/2013  FINDINGS: No mass lesion, mass effect, midline shift, hydrocephalus, hemorrhage. No territorial ischemia or acute infarction. Calvarium intact. Paranasal sinuses are within normal limits.  IMPRESSION: No acute intracranial abnormality.  Age-appropriate atrophy.   Electronically Signed   By: Andreas Newport M.D.   On: 10/08/2013 12:44   Ct Head Wo Contrast  09/28/2013   CLINICAL DATA:  Fell 11 p.m.  yesterday, head pain  EXAM: CT HEAD WITHOUT CONTRAST  TECHNIQUE: Contiguous axial images were obtained from the base of the skull through the vertex without intravenous contrast.  COMPARISON:  CT HEAD W/O CM dated 08/16/2010; DG CERVICAL SPINE 2-3 VIEWS dated 02/13/2009  FINDINGS: Stable mild atrophy. No evidence of hemorrhage, infarct, or mass. No mass effect or hydrocephalus. Calvarium is intact.  IMPRESSION: No significant change from prior study.  Age-related atrophy.   Electronically Signed   By: Esperanza Heir M.D.   On: 09/28/2013 08:18   Mr Brain Wo Contrast  10/08/2013   CLINICAL DATA Recent discharge for gastrointestinal bleeding and hip fracture. Altered mental status. Evaluate for possible stroke.  EXAM MRI HEAD WITHOUT CONTRAST  TECHNIQUE Multiplanar, multiecho pulse sequences of the brain and surrounding structures were obtained without intravenous contrast.  COMPARISON CT HEAD W/O CM dated 10/08/2013; CT HEAD W/O CM dated 08/16/2010; CT HEAD W/O CM dated 09/28/2013  FINDINGS Multifocal, subcentimeter, areas of acute infarction affect the periventricular and subcortical white matter on the right involving the posterior insular region extending to the centrum semiovale. There is no hemorrhage or mass effect. No cortical infarct is seen.  Moderate cerebral and cerebellar atrophy. Mild to moderate T2 and FLAIR hyperintensities consistent with chronic microvascular ischemic change. Flow voids are maintained in the carotid and basilar arteries. Both vertebrals are also patent. Partial empty sella. Mild cervical spondylosis. No osseous lesions. No acute sinus or mastoid disease.  Compared with prior CT studies, these infarcts are not visible.  IMPRESSION Multifocal  subcentimeter areas of acute deep white matter infarction involving the right MCA lenticulostriate territory. See discussion above. No hemorrhage or significant mass effect.  Moderate atrophy with chronic microvascular ischemic change.  SIGNATURE   Electronically Signed   By: Davonna Belling M.D.   On: 10/08/2013 18:47   Dg Pelvis Portable  10/03/2013   CLINICAL DATA:  Left hip replacement  EXAM: PORTABLE PELVIS 1-2 VIEWS  COMPARISON:  02/13/2009  FINDINGS: Left hip replacement in satisfactory position and alignment. No fracture or complication.  Right hip replacement previously performed appears satisfactory.  IMPRESSION: Satisfactory left hip replacement.   Electronically Signed   By: Marlan Palau M.D.   On: 10/03/2013 14:06   Ct Hip Left Wo Contrast  09/30/2013   CLINICAL DATA:  Left hip pain.  EXAM: CT OF THE LEFT HIP WITHOUT CONTRAST  TECHNIQUE: Multidetector CT imaging was performed according to the standard protocol. Multiplanar CT image reconstructions were also generated.  COMPARISON:  Radiographs 09/28/2013  FINDINGS: Severe degenerative disease involving the left hip with marked joint space narrowing, subchondral cystic change, osteophytic spurring and bony eburnation. There is a large subchondral geode involving the superior acetabulum. The femoral head is deformed and flattened without obvious AVN.  No acute fractures identified. A joint effusion is suspected. The left SI joint is intact. No obvious left-sided intrapelvic abnormality. No left inguinal mass or adenopathy.  There is subcutaneous edema involving the left hip region which could be due to trauma. No obvious muscular abnormality.  IMPRESSION: Severe progressive degenerative changes involving the left hip as described above.  No acute fracture.   Electronically Signed   By: Loralie Champagne M.D.   On: 09/30/2013 09:20   Dg Hip Portable 1 View Left  10/03/2013   CLINICAL DATA:  Left anterior hip replacement  EXAM: PORTABLE LEFT HIP - 1 VIEW  COMPARISON:  CT 09/30/2013  FINDINGS: Cross-table lateral view. Left hip replacement in satisfactory position and alignment. No fracture or complication.  IMPRESSION: Satisfactory left hip replacement, lateral view   Electronically Signed   By:  Marlan Palau M.D.   On: 10/03/2013 14:03   Dg Humerus Left  09/28/2013   CLINICAL DATA:  Pain post fall  EXAM: LEFT HUMERUS - 2+ VIEW  COMPARISON:  None.  FINDINGS: Advanced degenerative changes at the glenohumeral articulation with remodeling of the humeral head. Mild diffuse osteopenia. Negative for fracture, dislocation, or other acute bone abnormality.  IMPRESSION: 1. Negative for fracture or other acute bone abnormality. 2. Glenohumeral degenerative changes as above.   Electronically Signed   By: Oley Balm M.D.   On: 09/28/2013 11:12   Dg C-arm 1-60 Min-no Report  10/03/2013   CLINICAL DATA: intraop   C-ARM 1-60 MINUTES  Fluoroscopy was utilized by the requesting physician.  No radiographic  interpretation.     Microbiology: Recent Results (from the past 240 hour(s))  SURGICAL PCR SCREEN     Status: None   Collection Time    10/03/13  5:50 AM      Result Value Ref Range Status   MRSA, PCR NEGATIVE  NEGATIVE Final   Staphylococcus aureus NEGATIVE  NEGATIVE Final   Comment:            The Xpert SA Assay (FDA     approved for NASAL specimens     in patients over 68 years of age),     is one component of     a comprehensive surveillance     program.  Test performance has     been validated by [facility] for patients greater     than or equal to 52 year old.     It is not intended     to diagnose infection nor to     guide or monitor treatment.  URINE CULTURE     Status: None   Collection Time    10/09/13  2:50 AM      Result Value Ref Range Status   Specimen Description URINE, CATHETERIZED   Final   Special Requests NONE   Final   Culture  Setup Time     Final   Value: 10/09/2013 08:43     Performed at [facility] Count     Final   Value: 6,000 COLONIES/ML     Performed at [facility]   Culture     Final   Value: YEAST     Performed at [facility]   Report Status 10/10/2013 FINAL   Final     Labs: Basic Metabolic  Panel:  Recent Labs Lab 10/04/13 0403 10/05/13 0408 10/08/13 1146 10/09/13 0520 10/10/13 0442  NA 135* 137 140 141 144  K 5.1 4.3 3.3* 3.2* 3.4*  CL 98 100 99 102 105  CO2 27 28 27 29 28   GLUCOSE 204* 128* 110* 97 96  BUN 17 15 12 10 12   CREATININE 0.55 0.47* 0.43* 0.42* 0.48*  CALCIUM 8.9 8.9 9.1 8.7 8.7    Lab Results  Component Value Date   CHOL 183 10/10/2013   HDL 52 10/10/2013   LDLCALC 114* 10/10/2013   TRIG 86 10/10/2013   CHOLHDL 3.5 10/10/2013   Lab Results  Component Value Date   HGBA1C 5.7* 10/09/2013    Liver Function Tests:  Recent Labs Lab 10/08/13 1146 10/09/13 0520  AST 19 18  ALT 24 23  ALKPHOS 113 108  BILITOT 0.7 0.6  PROT 5.9* 5.5*  ALBUMIN 2.9* 2.7*   No results found for this basename: LIPASE, AMYLASE,  in the last 168 hours No results found for this basename: AMMONIA,  in the last 168 hours CBC:  Recent Labs Lab 10/05/13 0408 10/08/13 1146 10/08/13 1243 10/09/13 0520 10/10/13 0442  WBC 10.5 6.1 5.6 7.1 5.6  NEUTROABS  --   --  4.1  --   --   HGB 10.9* 11.7* 12.0 10.7* 10.4*  HCT 32.9* 34.1* 34.9* 32.1* 32.0*  MCV 91.9 89.3 90.4 91.2 92.2  PLT 279 415* 379 370 376   Cardiac Enzymes: No results found for this basename: CKTOTAL, CKMB, CKMBINDEX, TROPONINI,  in the last 168 hours BNP: BNP (last 3 results)  Recent Labs  09/29/13 0424  PROBNP 308.0   CBG:  Recent Labs Lab 10/08/13 1122  GLUCAP 106*    Time coordinating discharge: 45 minutes  Signed:  Antwuan Eckley  [facility] 10/10/2013, 1:37 PM

## 2013-10-15 ENCOUNTER — Non-Acute Institutional Stay (SKILLED_NURSING_FACILITY): Payer: Medicare HMO | Admitting: Internal Medicine

## 2013-10-15 ENCOUNTER — Encounter: Payer: Self-pay | Admitting: Internal Medicine

## 2013-10-15 DIAGNOSIS — I5032 Chronic diastolic (congestive) heart failure: Secondary | ICD-10-CM

## 2013-10-15 DIAGNOSIS — I6350 Cerebral infarction due to unspecified occlusion or stenosis of unspecified cerebral artery: Secondary | ICD-10-CM

## 2013-10-15 DIAGNOSIS — E785 Hyperlipidemia, unspecified: Secondary | ICD-10-CM | POA: Insufficient documentation

## 2013-10-15 DIAGNOSIS — I498 Other specified cardiac arrhythmias: Secondary | ICD-10-CM

## 2013-10-15 DIAGNOSIS — R030 Elevated blood-pressure reading, without diagnosis of hypertension: Secondary | ICD-10-CM

## 2013-10-15 DIAGNOSIS — E876 Hypokalemia: Secondary | ICD-10-CM

## 2013-10-15 DIAGNOSIS — Z96649 Presence of unspecified artificial hip joint: Secondary | ICD-10-CM

## 2013-10-15 DIAGNOSIS — I471 Supraventricular tachycardia: Secondary | ICD-10-CM

## 2013-10-15 DIAGNOSIS — I639 Cerebral infarction, unspecified: Secondary | ICD-10-CM

## 2013-10-15 DIAGNOSIS — IMO0001 Reserved for inherently not codable concepts without codable children: Secondary | ICD-10-CM | POA: Insufficient documentation

## 2013-10-15 DIAGNOSIS — D62 Acute posthemorrhagic anemia: Secondary | ICD-10-CM

## 2013-10-15 NOTE — Assessment & Plan Note (Signed)
Lipitor 20mg daily

## 2013-10-15 NOTE — Assessment & Plan Note (Signed)
Pt started on Lisinopril 5 mg daily

## 2013-10-15 NOTE — Assessment & Plan Note (Signed)
On ACE now, lasix QOD and statin

## 2013-10-15 NOTE — Assessment & Plan Note (Signed)
Did not see mention of this in note but pt is rec to f/u with cards for telemetry

## 2013-10-15 NOTE — Assessment & Plan Note (Signed)
Repleted and started on K+

## 2013-10-15 NOTE — Assessment & Plan Note (Addendum)
Multiple sub-centimeter acute R MCA strokes, probably embolic in nature, probable cause of LOC and MS change;ECHO -neg, A1c-not diabetic;FLP-pt started on statin;elevated BP, started on Lisinopril;hip surgery prophylaxis of ASA BID changed to Plavix 75 mg daily Follow up Dr Pearlean BrownieSethi 1 month - needs dementia testing

## 2013-10-15 NOTE — Assessment & Plan Note (Signed)
Noted Hb trending down;continue iron and repeat CBC 1 week

## 2013-10-15 NOTE — Progress Notes (Signed)
MRN: [id number] [facility]  Sex: female Age: 79 y.o. DOB: 05/03/1935  [facility] Facility/Room: 130A Level Of Care: SNF Provider: Merrilee SeashoreALEXANDER, Azzure Garabedian D Emergency Contacts: Extended Emergency Contact Information Primary Emergency Contact: Sexton,Sherry  [place] Home Phone: [phone] Work Phone: [phone] Mobile Phone: [phone] Relation: Daughter Secondary Emergency Contact: Danella PentonParrish,Linda          [place], [place] [address] [place] Home Phone: [phone] Relation: Friend  Code Status:   Allergies: Other; Amoxicillin; Iohexol; and Penicillins  Chief Complaint  Patient presents with  . nursing home admission    HPI: Patient is 79 y.o. female who had an unresponsive episode after being admitted to SNF s/p THR and was admitted to the hospital for acute stroke. Pt is being admitted again to SNF to continue OT/PT for hip and for CVA.  Past Medical History  Diagnosis Date  . Renal disorder   . Blood transfusion   . Osteomyelitis   . Arthritis   . Hyperlipidemia     Past Surgical History  Procedure Laterality Date  . Bladder surgery    . Total hip arthroplasty    . Tubal ligation    . Eye surgery    . Tonsillectomy    . Total hip arthroplasty Left 10/03/2013    Procedure: TOTAL LEFT HIP ARTHROPLASTY ANTERIOR APPROACH;  Surgeon: Shelda PalMatthew D Olin, MD;  Location: [facility];  Service: Orthopedics;  Laterality: Left;  . Joint replacement        Medication List       This list is accurate as of: 10/15/13 10:28 PM.  Always use your most recent med list.               atorvastatin 20 MG tablet  Commonly known as:  LIPITOR  Take 1 tablet (20 mg total) by mouth daily at 6 PM.     clopidogrel 75 MG tablet  Commonly known as:  PLAVIX  Take 1 tablet (75 mg total) by mouth daily with breakfast.     DSS 100 MG Caps  Take 100 mg by mouth daily.     ferrous sulfate 325 (65 FE) MG tablet  Take 1 tablet (325 mg total) by mouth  3 (three) times daily after meals.     furosemide 20 MG tablet  Commonly known as:  LASIX  Take 1 tablet (20 mg total) by mouth every other day.     lisinopril 5 MG tablet  Commonly known as:  PRINIVIL,ZESTRIL  Take 1 tablet (5 mg total) by mouth daily.     ondansetron 4 MG tablet  Commonly known as:  ZOFRAN  Take 1 tablet (4 mg total) by mouth every 6 (six) hours as needed for nausea.     polyethylene glycol packet  Commonly known as:  MIRALAX / GLYCOLAX  Take 17 g by mouth daily as needed for mild constipation.     potassium chloride 10 MEQ tablet  Commonly known as:  K-DUR  Take 1 tablet (10 mEq total) by mouth daily.        No orders of the defined types were placed in this encounter.     There is no immunization history on file for this patient.  History  Substance Use Topics  . Smoking status: Never Smoker   . Smokeless tobacco: Never Used  . Alcohol Use: No    Family history is noncontributory    Review of Systems  DATA OBTAINED: from patient; only c/o is  she wants another bed and she wants to be able to go to the BR by herself GENERAL: Feels well no fevers, fatigue, appetite changes SKIN: No itching, rash or wounds EYES: No eye pain, redness, discharge EARS: No earache, tinnitus, change in hearing NOSE: No congestion, drainage or bleeding  MOUTH/THROAT: No mouth or tooth pain, No sore throat, No difficulty chewing or swallowing  RESPIRATORY: No cough, wheezing, SOB CARDIAC: No chest pain, palpitations, lower extremity edema  GI: No abdominal pain, No N/V/D or constipation, No heartburn or reflux  GU: No dysuria, frequency or urgency, or incontinence  MUSCULOSKELETAL: No unrelieved bone/joint pain NEUROLOGIC: No headache, dizziness or focal weakness;PT SAYS HER BALANCE IS REALLY BAD PSYCHIATRIC: No overt anxiety or sadness. Sleeps well. No behavior issue.   Filed Vitals:   10/15/13 2144  BP: 139/65  Pulse: 69  Temp: 99.5 F (37.5 C)  Resp: 21     Physical Exam  GENERAL APPEARANCE: Alert, conversant. Appropriately groomed. No acute distress.  SKIN: No diaphoresis rash, or wounds HEAD: Normocephalic, atraumatic  EYES: Conjunctiva/lids clear. Pupils round, reactive. EOMs intact.  EARS: External exam WNL, canals clear. Hearing grossly normal.  NOSE: No deformity or discharge.  MOUTH/THROAT: Lips w/o lesions.  RESPIRATORY: Breathing is even, unlabored. Lung sounds are clear   CARDIOVASCULAR: Heart RRR no murmurs, rubs or gallops. No peripheral edema.  GASTROINTESTINAL: Abdomen is soft, non-tender, not distended w/ normal bowel sounds GENITOURINARY: Bladder non tender, not distended  MUSCULOSKELETAL: No abnormal joints or musculature NEUROLOGIC: Oriented X3. Cranial nerves 2-12 grossly intact. Moves all extremities no tremor. PSYCHIATRIC: Mood and affect appropriate to situation, no behavioral issues  Patient Active Problem List   Diagnosis Date Noted  . Elevated blood pressure 10/15/2013  . Hyperlipidemia   . SVT (supraventricular tachycardia) 10/10/2013  . Chronic diastolic heart failure 10/10/2013  . CVA (cerebral infarction) 10/09/2013  . Black stools 10/08/2013  . Acute blood loss anemia 10/08/2013  . Altered mental status 10/08/2013  . Unresponsive 10/08/2013  . S/P left THA 10/03/2013  . Left hip pain 09/30/2013  . Osteoarthritis of left hip 09/30/2013  . Fall 09/29/2013  . Generalized weakness 09/29/2013  . Hypokalemia 09/29/2013  . UTI (lower urinary tract infection) 09/28/2013  . Bilateral leg edema 09/28/2013  . FOOT PAIN 08/29/2008    CBC    Component Value Date/Time   WBC 5.6 10/10/2013 0442   RBC 3.47* 10/10/2013 0442   HGB 10.4* 10/10/2013 0442   HCT 32.0* 10/10/2013 0442   PLT 376 10/10/2013 0442   MCV 92.2 10/10/2013 0442   LYMPHSABS 0.8 10/08/2013 1243   MONOABS 0.6 10/08/2013 1243   EOSABS 0.0 10/08/2013 1243   BASOSABS 0.0 10/08/2013 1243    CMP     Component Value Date/Time   NA 144  10/10/2013 0442   K 3.4* 10/10/2013 0442   CL 105 10/10/2013 0442   CO2 28 10/10/2013 0442   GLUCOSE 96 10/10/2013 0442   BUN 12 10/10/2013 0442   CREATININE 0.48* 10/10/2013 0442   CALCIUM 8.7 10/10/2013 0442   PROT 5.5* 10/09/2013 0520   ALBUMIN 2.7* 10/09/2013 0520   AST 18 10/09/2013 0520   ALT 23 10/09/2013 0520   ALKPHOS 108 10/09/2013 0520   BILITOT 0.6 10/09/2013 0520   GFRNONAA >90 10/10/2013 0442   GFRAA >90 10/10/2013 0442    Assessment and Plan  CVA (cerebral infarction) Multiple sub-centimeter acute R MCA strokes, probably embolic in nature, probable cause of LOC and MS change;ECHO -neg, A1c-not  diabetic;FLP-pt started on statin;elevated BP, started on Lisinopril;hip surgery prophylaxis of ASA BID changed to Plavix 75 mg daily Follow up Dr Pearlean Brownie 1 month - needs dementia testing  S/P left THA Ortho OK with plavix;follow up as before and continue OT/PT, TED hose;f/u [place] [facility] 3/19  Acute blood loss anemia Noted Hb trending down;continue iron and repeat CBC 1 week  Chronic diastolic heart failure On ACE now, lasix QOD and statin  SVT (supraventricular tachycardia) Did not see mention of this in note but pt is rec to f/u with cards for telemetry  Hypokalemia Repleted and started on K+  Elevated blood pressure Pt started on Lisinopril 5 mg daily  Hyperlipidemia Lipitor 20 mg daily    Margit Hanks, MD

## 2013-10-15 NOTE — Assessment & Plan Note (Addendum)
Ortho OK with plavix;follow up as before and continue OT/PT, TED hose;f/u [place] [facility] 3/19

## 2013-11-07 ENCOUNTER — Ambulatory Visit: Payer: Medicare HMO | Admitting: Neurology

## 2013-11-19 ENCOUNTER — Encounter: Payer: Self-pay | Admitting: Internal Medicine

## 2013-11-19 ENCOUNTER — Non-Acute Institutional Stay (SKILLED_NURSING_FACILITY): Payer: Medicare HMO | Admitting: Internal Medicine

## 2013-11-19 DIAGNOSIS — I639 Cerebral infarction, unspecified: Secondary | ICD-10-CM

## 2013-11-19 DIAGNOSIS — D62 Acute posthemorrhagic anemia: Secondary | ICD-10-CM

## 2013-11-19 DIAGNOSIS — I498 Other specified cardiac arrhythmias: Secondary | ICD-10-CM

## 2013-11-19 DIAGNOSIS — I471 Supraventricular tachycardia: Secondary | ICD-10-CM

## 2013-11-19 DIAGNOSIS — R030 Elevated blood-pressure reading, without diagnosis of hypertension: Secondary | ICD-10-CM

## 2013-11-19 DIAGNOSIS — I5032 Chronic diastolic (congestive) heart failure: Secondary | ICD-10-CM

## 2013-11-19 DIAGNOSIS — E785 Hyperlipidemia, unspecified: Secondary | ICD-10-CM

## 2013-11-19 DIAGNOSIS — I6350 Cerebral infarction due to unspecified occlusion or stenosis of unspecified cerebral artery: Secondary | ICD-10-CM

## 2013-11-19 DIAGNOSIS — E876 Hypokalemia: Secondary | ICD-10-CM

## 2013-11-19 DIAGNOSIS — IMO0001 Reserved for inherently not codable concepts without codable children: Secondary | ICD-10-CM

## 2013-11-19 NOTE — Progress Notes (Signed)
MRN: [id number] Name: Kendra Snyder  Sex: female Age: 79 y.o. DOB: 09/27/1934  [facility]Room: 130A Level Of Care: SNF Provider: Margit HanksAnne D Linday Rhodes Emergency Contacts: Extended Emergency Contact Information Primary Emergency Contact: Sexton,Sherry  [place] Home Phone: [phone] Work Phone: [phone] Mobile Phone: [phone] Relation: Daughter Secondary Emergency Contact: Danella PentonParrish,Linda          [place], [place] [address] [place] Home Phone: [phone] Relation: Friend  Code Status:   Allergies: Other; Amoxicillin; Iohexol; and Penicillins  Chief Complaint  Patient presents with  . Discharge Note    HPI: Patient is 79 y.o. female who was admitted to SNF after THR who is now ready to be discharged to ALF.  Past Medical History  Diagnosis Date  . Renal disorder   . Blood transfusion   . Osteomyelitis   . Arthritis   . Hyperlipidemia     Past Surgical History  Procedure Laterality Date  . Bladder surgery    . Total hip arthroplasty    . Tubal ligation    . Eye surgery    . Tonsillectomy    . Total hip arthroplasty Left 10/03/2013    Procedure: TOTAL LEFT HIP ARTHROPLASTY ANTERIOR APPROACH;  Surgeon: Shelda PalMatthew D Olin, MD;  Location: [facility];  Service: Orthopedics;  Laterality: Left;  . Joint replacement        Medication List       This list is accurate as of: 11/19/13  1:51 PM.  Always use your most recent med list.               atorvastatin 20 MG tablet  Commonly known as:  LIPITOR  Take 1 tablet (20 mg total) by mouth daily at 6 PM.     clopidogrel 75 MG tablet  Commonly known as:  PLAVIX  Take 1 tablet (75 mg total) by mouth daily with breakfast.     DSS 100 MG Caps  Take 100 mg by mouth daily. And 1 prn     ferrous sulfate 325 (65 FE) MG tablet  Take 1 tablet (325 mg total) by mouth 3 (three) times daily after meals.     furosemide 20 MG tablet  Commonly known as:  LASIX  Take 1 tablet (20  mg total) by mouth every other day.     lisinopril 5 MG tablet  Commonly known as:  PRINIVIL,ZESTRIL  Take 1 tablet (5 mg total) by mouth daily.     ondansetron 4 MG tablet  Commonly known as:  ZOFRAN  Take 1 tablet (4 mg total) by mouth every 6 (six) hours as needed for nausea.     polyethylene glycol packet  Commonly known as:  MIRALAX / GLYCOLAX  Take 17 g by mouth daily as needed for mild constipation.     potassium chloride 10 MEQ tablet  Commonly known as:  K-DUR  Take 1 tablet (10 mEq total) by mouth daily.        Meds ordered this encounter  Medications  . Docusate Sodium (DSS) 100 MG CAPS    Sig: Take 100 mg by mouth daily. And 1 prn     There is no immunization history on file for this patient.  History  Substance Use Topics  . Smoking status: Never Smoker   . Smokeless tobacco: Never Used  . Alcohol Use: No    Filed Vitals:   11/19/13 1350  BP: 136/63  Pulse: 65  Temp: 97 F (36.1 C)  Resp: 20    Physical Exam  GENERAL APPEARANCE: Alert, conversant. Appropriately groomed. No acute distress.  HEENT: Unremarkable. RESPIRATORY: Breathing is even, unlabored. Lung sounds are clear   CARDIOVASCULAR: Heart RRR no murmurs, rubs or gallops. No peripheral edema.  GASTROINTESTINAL: Abdomen is soft, non-tender, not distended w/ normal bowel sounds.  NEUROLOGIC: Cranial nerves 2-12 grossly intact. Moves all extremities no tremor.  Patient Active Problem List   Diagnosis Date Noted  . Elevated blood pressure 10/15/2013  . Hyperlipidemia   . SVT (supraventricular tachycardia) 10/10/2013  . Chronic diastolic heart failure 10/10/2013  . CVA (cerebral infarction) 10/09/2013  . Black stools 10/08/2013  . Acute blood loss anemia 10/08/2013  . Altered mental status 10/08/2013  . Unresponsive 10/08/2013  . S/P left THA 10/03/2013  . Left hip pain 09/30/2013  . Osteoarthritis of left hip 09/30/2013  . Fall 09/29/2013  . Generalized weakness 09/29/2013  .  Hypokalemia 09/29/2013  . UTI (lower urinary tract infection) 09/28/2013  . Bilateral leg edema 09/28/2013  . FOOT PAIN 08/29/2008    CBC    Component Value Date/Time   WBC 5.6 10/10/2013 0442   RBC 3.47* 10/10/2013 0442   HGB 10.4* 10/10/2013 0442   HCT 32.0* 10/10/2013 0442   PLT 376 10/10/2013 0442   MCV 92.2 10/10/2013 0442   LYMPHSABS 0.8 10/08/2013 1243   MONOABS 0.6 10/08/2013 1243   EOSABS 0.0 10/08/2013 1243   BASOSABS 0.0 10/08/2013 1243    CMP     Component Value Date/Time   NA 144 10/10/2013 0442   K 3.4* 10/10/2013 0442   CL 105 10/10/2013 0442   CO2 28 10/10/2013 0442   GLUCOSE 96 10/10/2013 0442   BUN 12 10/10/2013 0442   CREATININE 0.48* 10/10/2013 0442   CALCIUM 8.7 10/10/2013 0442   PROT 5.5* 10/09/2013 0520   ALBUMIN 2.7* 10/09/2013 0520   AST 18 10/09/2013 0520   ALT 23 10/09/2013 0520   ALKPHOS 108 10/09/2013 0520   BILITOT 0.6 10/09/2013 0520   GFRNONAA >90 10/10/2013 0442   GFRAA >90 10/10/2013 0442    Assessment and Plan  [facility] and [facility]  Margit HanksAnne D Salwa Bai, MD

## 2013-12-02 ENCOUNTER — Ambulatory Visit: Payer: Medicare HMO | Admitting: Internal Medicine

## 2013-12-02 DIAGNOSIS — Z0289 Encounter for other administrative examinations: Secondary | ICD-10-CM

## 2015-09-29 DIAGNOSIS — Z6824 Body mass index (BMI) 24.0-24.9, adult: Secondary | ICD-10-CM | POA: Diagnosis not present

## 2015-09-29 DIAGNOSIS — I6930 Unspecified sequelae of cerebral infarction: Secondary | ICD-10-CM | POA: Diagnosis not present

## 2015-09-29 DIAGNOSIS — R32 Unspecified urinary incontinence: Secondary | ICD-10-CM | POA: Diagnosis not present

## 2015-09-29 DIAGNOSIS — R600 Localized edema: Secondary | ICD-10-CM | POA: Diagnosis not present

## 2016-01-26 DIAGNOSIS — R413 Other amnesia: Secondary | ICD-10-CM | POA: Diagnosis not present

## 2016-01-26 DIAGNOSIS — Z6823 Body mass index (BMI) 23.0-23.9, adult: Secondary | ICD-10-CM | POA: Diagnosis not present

## 2016-01-26 DIAGNOSIS — N819 Female genital prolapse, unspecified: Secondary | ICD-10-CM | POA: Diagnosis not present

## 2016-01-26 DIAGNOSIS — Z8673 Personal history of transient ischemic attack (TIA), and cerebral infarction without residual deficits: Secondary | ICD-10-CM | POA: Diagnosis not present

## 2016-02-03 DIAGNOSIS — N390 Urinary tract infection, site not specified: Secondary | ICD-10-CM | POA: Diagnosis not present

## 2016-02-20 IMAGING — CR DG HIP 1V PORT*L*
1 series · 1 of 1 positions shown · non-contrast
Comparison: CT 09/30/2013

CLINICAL DATA: Left anterior hip replacement

EXAM:
PORTABLE LEFT HIP - 1 VIEW

[AP]
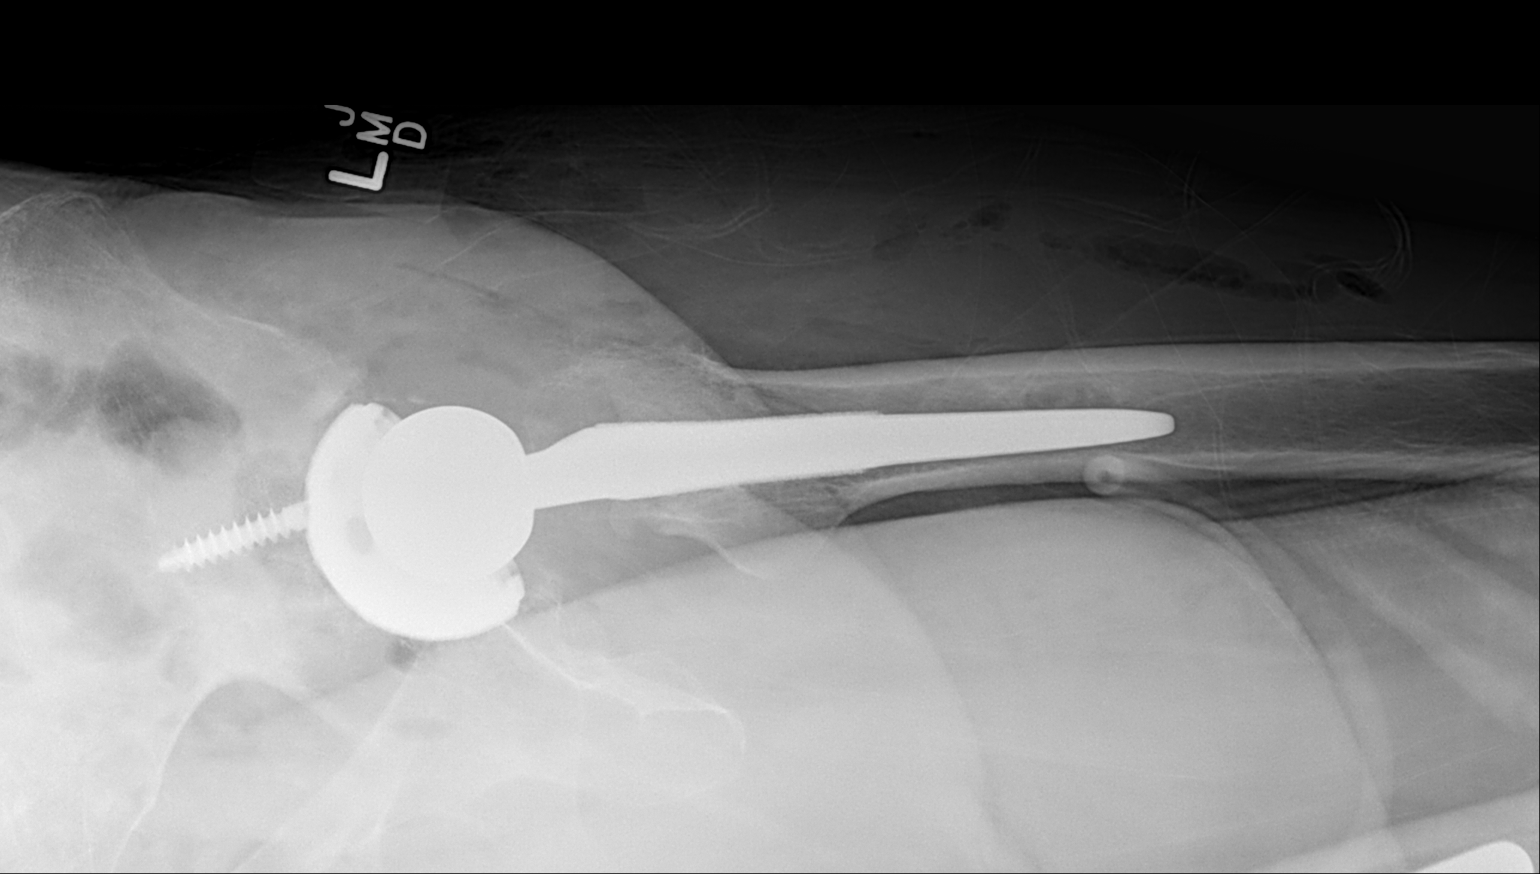

[1 of 1 positions shown; findings below may reference images not displayed]

FINDINGS: Cross-table lateral view. Left hip replacement in satisfactory
position and alignment. No fracture or complication.
IMPRESSION: Satisfactory left hip replacement, lateral view

## 2016-02-25 IMAGING — CT CT HEAD W/O CM
2 series · 16 of 30 positions shown, 20 images · non-contrast
Comparison: CT HEAD W/O CM dated 09/28/2013

CLINICAL DATA: Altered mental status.  Unresponsive patient.

EXAM:
CT HEAD WITHOUT CONTRAST
TECHNIQUE: Contiguous axial images were obtained from the base of the skull
through the vertex without intravenous contrast.

[Series 2: head w/o · axial · non-contrast · 0.48mm/px · z∈[-63,+57]mm · 13 of 28 slices shown, 17 images]
[im 2/28  brain]
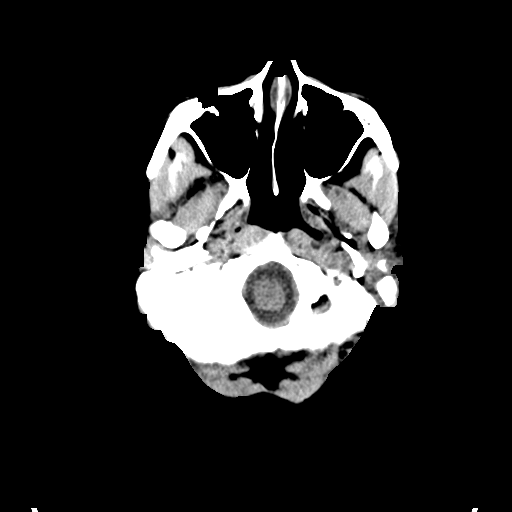
[im 2/28  bone]
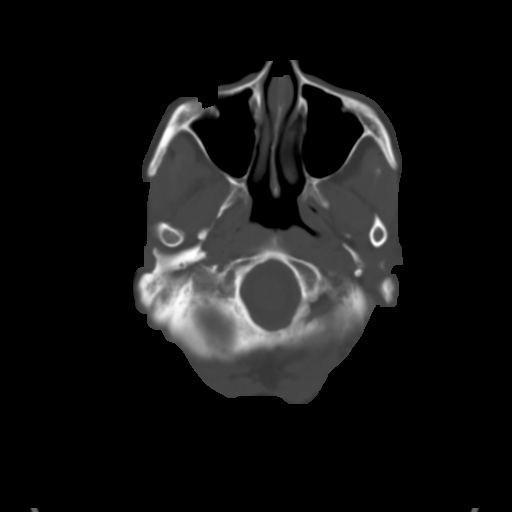
[im 4/28  brain]
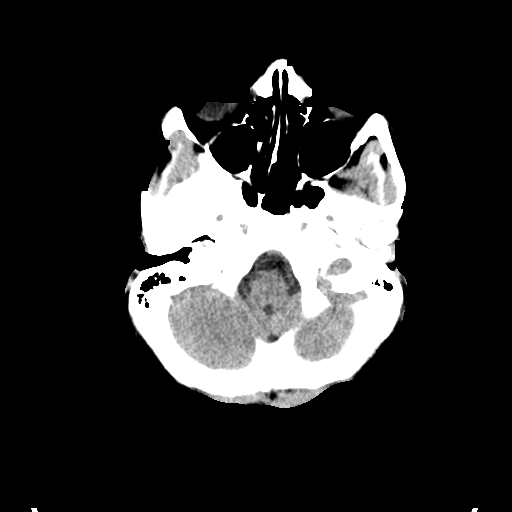
[im 6/28  brain]
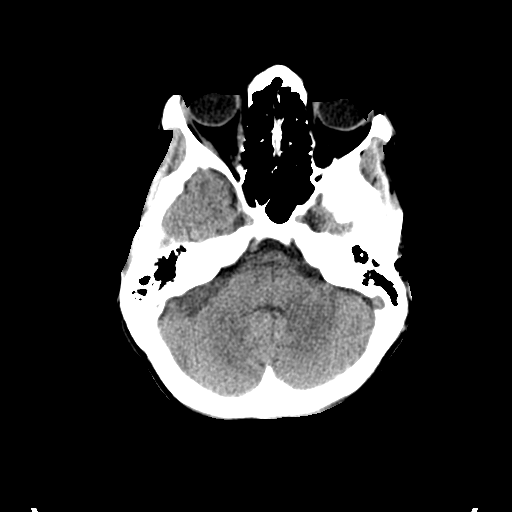
[im 8/28  brain]
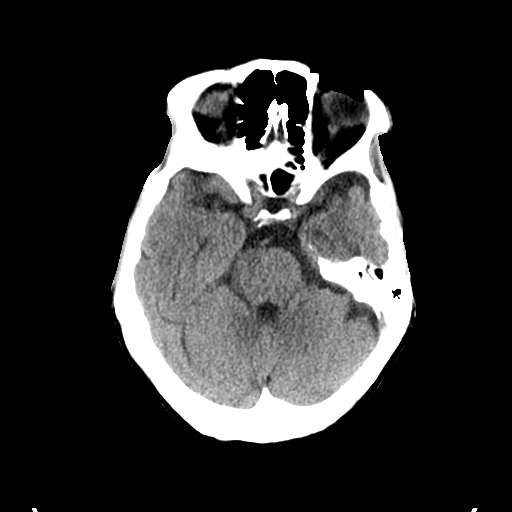
[im 10/28  brain]
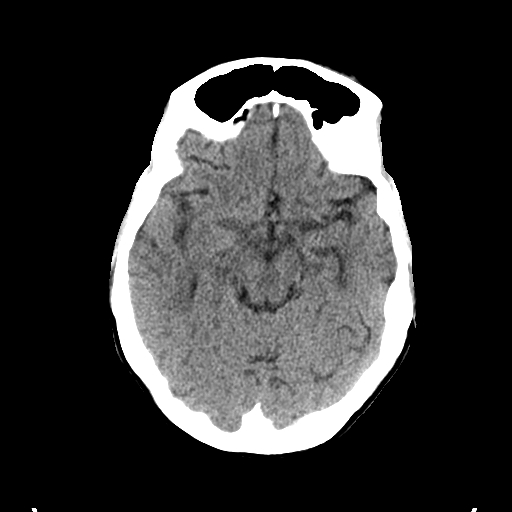
[im 10/28  bone]
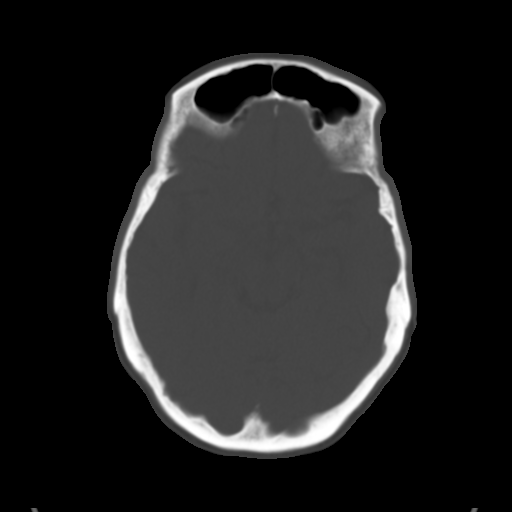
[im 12/28  brain]
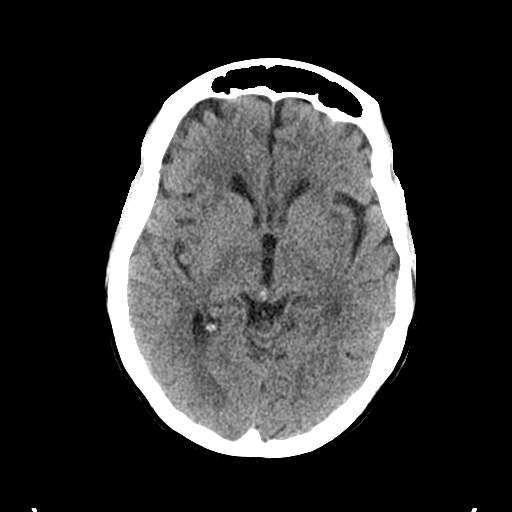
[im 14/28  brain]
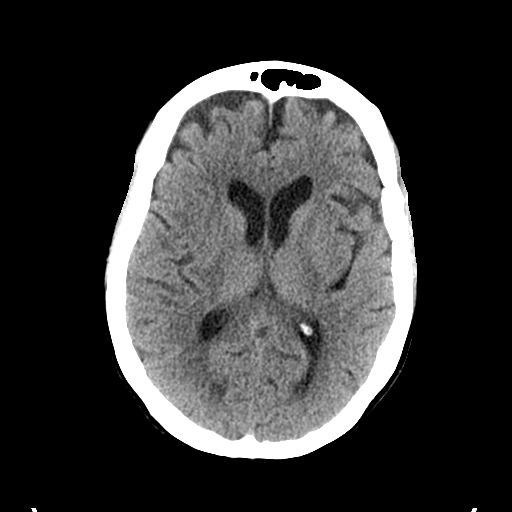
[im 16/28  brain]
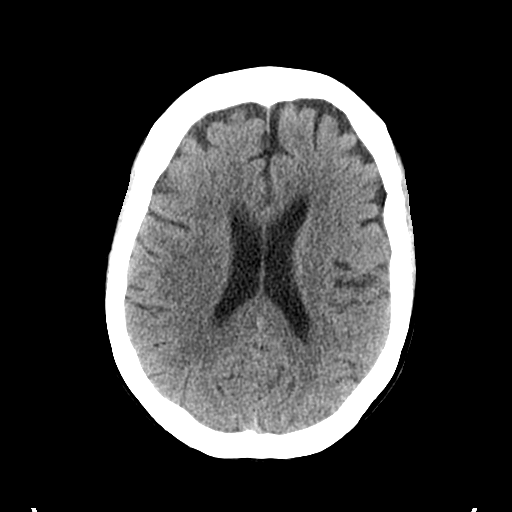
[im 18/28  brain]
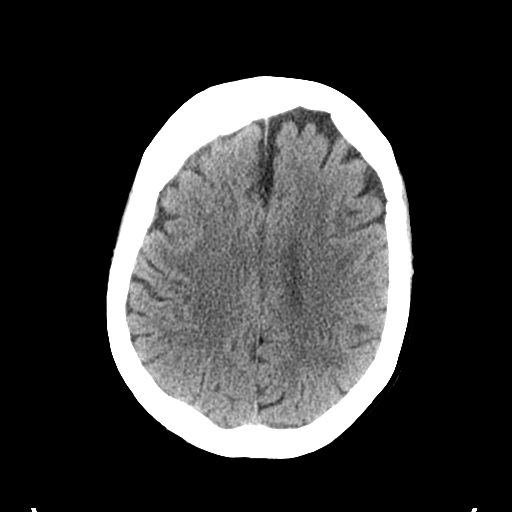
[im 18/28  bone]
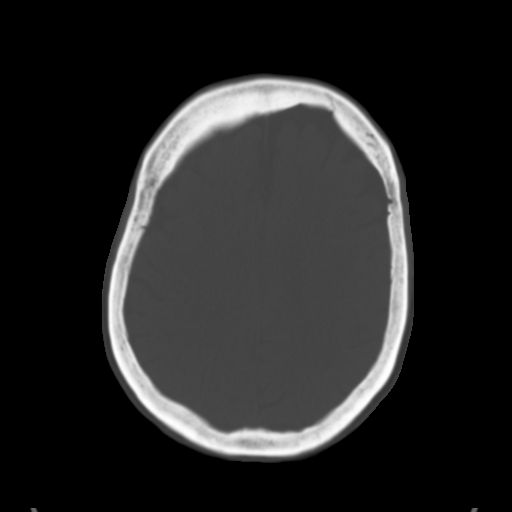
[im 20/28  brain]
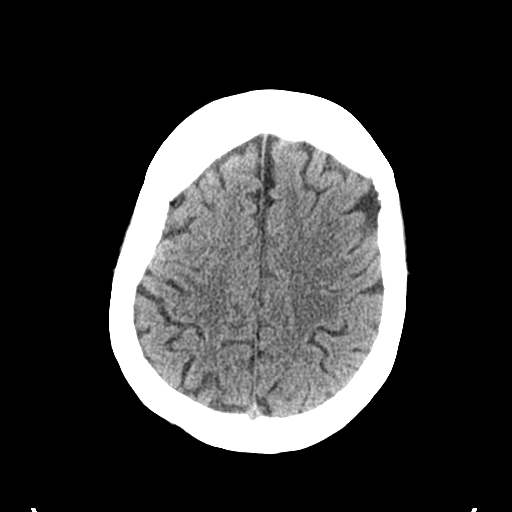
[im 22/28  brain]
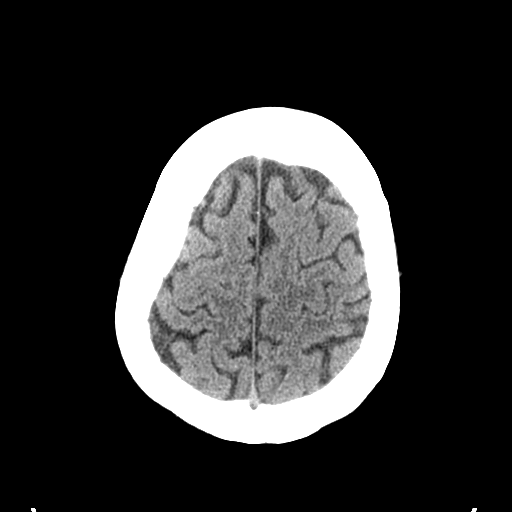
[im 24/28  brain]
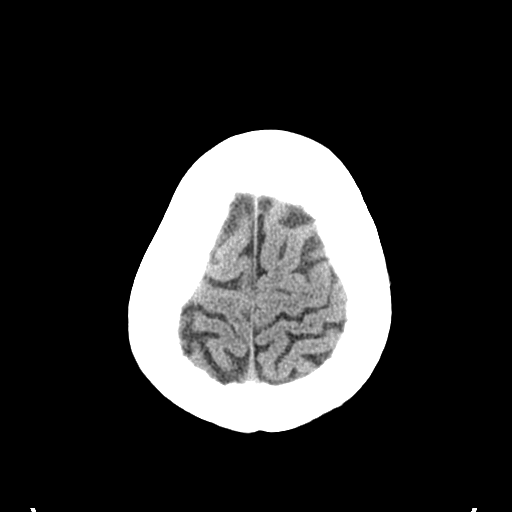
[im 26/28  brain]
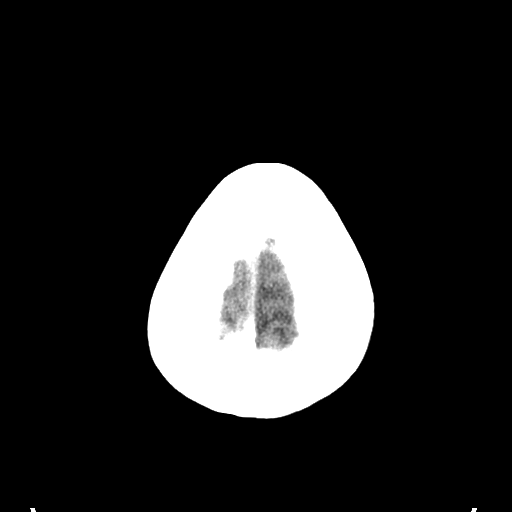
[im 26/28  bone]
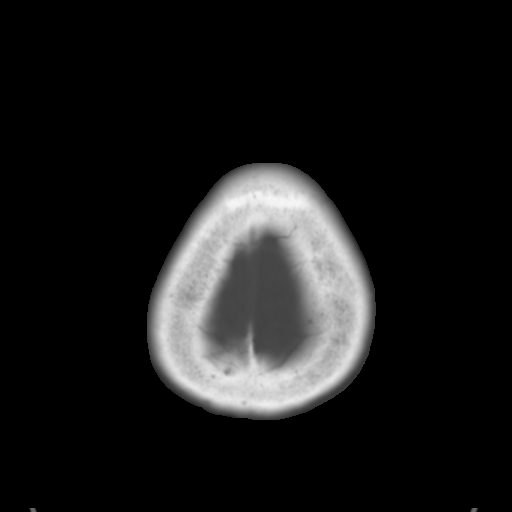

[Series 3: bone windows · axial · 0.48mm/px · z∈[-63,-23]mm · 3 of 28 slices shown]
[im 2/28  bone]
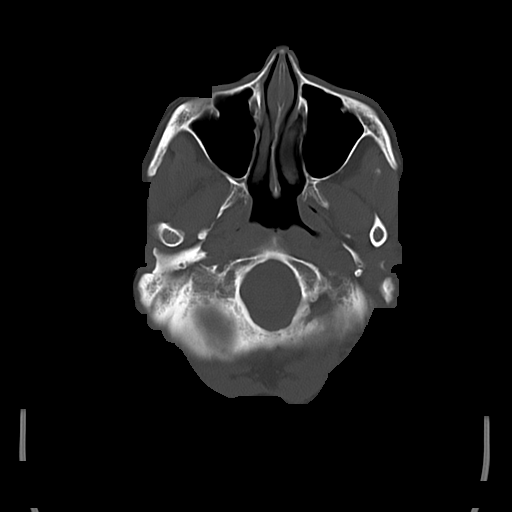
[im 6/28  bone]
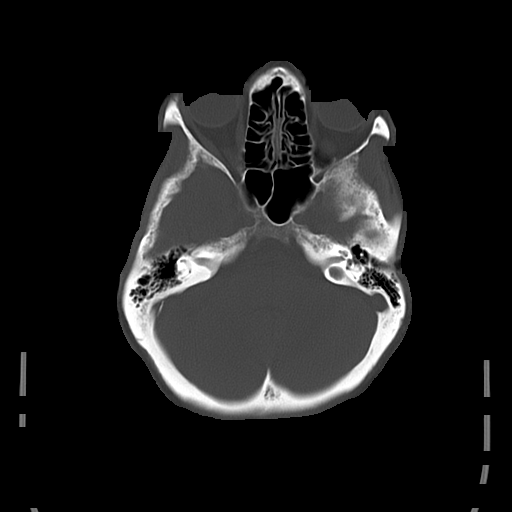
[im 10/28  bone]
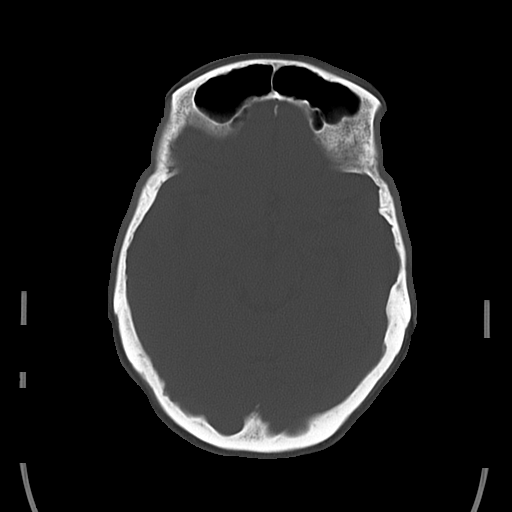

[16 of 30 positions shown; findings below may reference images not displayed]

FINDINGS: No mass lesion, mass effect, midline shift, hydrocephalus,
hemorrhage. No territorial ischemia or acute infarction. Calvarium
intact. Paranasal sinuses are within normal limits.
IMPRESSION: No acute intracranial abnormality.  Age-appropriate atrophy.

## 2016-03-11 DIAGNOSIS — W1830XA Fall on same level, unspecified, initial encounter: Secondary | ICD-10-CM | POA: Diagnosis not present

## 2016-03-11 DIAGNOSIS — I10 Essential (primary) hypertension: Secondary | ICD-10-CM | POA: Diagnosis not present

## 2016-03-11 DIAGNOSIS — Z8673 Personal history of transient ischemic attack (TIA), and cerebral infarction without residual deficits: Secondary | ICD-10-CM | POA: Diagnosis not present

## 2016-03-11 DIAGNOSIS — S0990XA Unspecified injury of head, initial encounter: Secondary | ICD-10-CM | POA: Diagnosis not present

## 2016-03-11 DIAGNOSIS — E860 Dehydration: Secondary | ICD-10-CM | POA: Diagnosis not present

## 2016-03-11 DIAGNOSIS — R531 Weakness: Secondary | ICD-10-CM | POA: Diagnosis not present

## 2016-03-11 DIAGNOSIS — R410 Disorientation, unspecified: Secondary | ICD-10-CM | POA: Diagnosis not present

## 2016-03-15 DIAGNOSIS — Z7902 Long term (current) use of antithrombotics/antiplatelets: Secondary | ICD-10-CM | POA: Diagnosis not present

## 2016-03-15 DIAGNOSIS — Z9181 History of falling: Secondary | ICD-10-CM | POA: Diagnosis not present

## 2016-03-15 DIAGNOSIS — I509 Heart failure, unspecified: Secondary | ICD-10-CM | POA: Diagnosis not present

## 2016-03-15 DIAGNOSIS — R2689 Other abnormalities of gait and mobility: Secondary | ICD-10-CM | POA: Diagnosis not present

## 2016-03-15 DIAGNOSIS — F329 Major depressive disorder, single episode, unspecified: Secondary | ICD-10-CM | POA: Diagnosis not present

## 2016-03-15 DIAGNOSIS — Z8673 Personal history of transient ischemic attack (TIA), and cerebral infarction without residual deficits: Secondary | ICD-10-CM | POA: Diagnosis not present

## 2016-03-15 DIAGNOSIS — F419 Anxiety disorder, unspecified: Secondary | ICD-10-CM | POA: Diagnosis not present

## 2016-03-15 DIAGNOSIS — I110 Hypertensive heart disease with heart failure: Secondary | ICD-10-CM | POA: Diagnosis not present

## 2016-03-15 DIAGNOSIS — E119 Type 2 diabetes mellitus without complications: Secondary | ICD-10-CM | POA: Diagnosis not present

## 2016-03-15 DIAGNOSIS — R413 Other amnesia: Secondary | ICD-10-CM | POA: Diagnosis not present

## 2016-03-15 DIAGNOSIS — R531 Weakness: Secondary | ICD-10-CM | POA: Diagnosis not present

## 2016-03-15 DIAGNOSIS — M810 Age-related osteoporosis without current pathological fracture: Secondary | ICD-10-CM | POA: Diagnosis not present

## 2016-03-15 DIAGNOSIS — M1991 Primary osteoarthritis, unspecified site: Secondary | ICD-10-CM | POA: Diagnosis not present

## 2016-03-15 DIAGNOSIS — Z7983 Long term (current) use of bisphosphonates: Secondary | ICD-10-CM | POA: Diagnosis not present

## 2016-03-26 DIAGNOSIS — Z6823 Body mass index (BMI) 23.0-23.9, adult: Secondary | ICD-10-CM | POA: Diagnosis not present

## 2016-03-26 DIAGNOSIS — R609 Edema, unspecified: Secondary | ICD-10-CM | POA: Diagnosis not present

## 2016-03-28 DIAGNOSIS — R32 Unspecified urinary incontinence: Secondary | ICD-10-CM | POA: Diagnosis not present

## 2016-03-28 DIAGNOSIS — Z9181 History of falling: Secondary | ICD-10-CM | POA: Diagnosis not present

## 2016-03-28 DIAGNOSIS — R600 Localized edema: Secondary | ICD-10-CM | POA: Diagnosis not present

## 2016-03-28 DIAGNOSIS — R413 Other amnesia: Secondary | ICD-10-CM | POA: Diagnosis not present

## 2016-03-28 DIAGNOSIS — F0390 Unspecified dementia without behavioral disturbance: Secondary | ICD-10-CM | POA: Diagnosis not present

## 2016-03-28 DIAGNOSIS — Z8673 Personal history of transient ischemic attack (TIA), and cerebral infarction without residual deficits: Secondary | ICD-10-CM | POA: Diagnosis not present

## 2016-03-28 DIAGNOSIS — Z6824 Body mass index (BMI) 24.0-24.9, adult: Secondary | ICD-10-CM | POA: Diagnosis not present

## 2016-03-28 DIAGNOSIS — R4701 Aphasia: Secondary | ICD-10-CM | POA: Diagnosis not present

## 2016-03-28 DIAGNOSIS — R2689 Other abnormalities of gait and mobility: Secondary | ICD-10-CM | POA: Diagnosis not present

## 2016-04-02 DIAGNOSIS — R1012 Left upper quadrant pain: Secondary | ICD-10-CM | POA: Diagnosis not present

## 2016-04-02 DIAGNOSIS — R339 Retention of urine, unspecified: Secondary | ICD-10-CM | POA: Diagnosis not present

## 2016-04-02 DIAGNOSIS — R109 Unspecified abdominal pain: Secondary | ICD-10-CM | POA: Diagnosis not present

## 2016-04-02 DIAGNOSIS — R1031 Right lower quadrant pain: Secondary | ICD-10-CM | POA: Diagnosis not present

## 2016-04-02 DIAGNOSIS — R1033 Periumbilical pain: Secondary | ICD-10-CM | POA: Diagnosis not present

## 2016-04-05 DIAGNOSIS — I509 Heart failure, unspecified: Secondary | ICD-10-CM | POA: Diagnosis not present

## 2016-04-05 DIAGNOSIS — E119 Type 2 diabetes mellitus without complications: Secondary | ICD-10-CM | POA: Diagnosis not present

## 2016-04-05 DIAGNOSIS — R2689 Other abnormalities of gait and mobility: Secondary | ICD-10-CM | POA: Diagnosis not present

## 2016-04-05 DIAGNOSIS — I110 Hypertensive heart disease with heart failure: Secondary | ICD-10-CM | POA: Diagnosis not present

## 2016-04-05 DIAGNOSIS — R531 Weakness: Secondary | ICD-10-CM | POA: Diagnosis not present

## 2016-05-16 DIAGNOSIS — R32 Unspecified urinary incontinence: Secondary | ICD-10-CM | POA: Diagnosis not present

## 2016-05-16 DIAGNOSIS — F0150 Vascular dementia without behavioral disturbance: Secondary | ICD-10-CM | POA: Diagnosis not present

## 2016-05-16 DIAGNOSIS — R413 Other amnesia: Secondary | ICD-10-CM | POA: Diagnosis not present

## 2016-05-24 DIAGNOSIS — N318 Other neuromuscular dysfunction of bladder: Secondary | ICD-10-CM | POA: Diagnosis not present

## 2016-05-24 DIAGNOSIS — N3000 Acute cystitis without hematuria: Secondary | ICD-10-CM | POA: Diagnosis not present

## 2016-05-24 DIAGNOSIS — N8111 Cystocele, midline: Secondary | ICD-10-CM | POA: Diagnosis not present

## 2016-05-24 DIAGNOSIS — N393 Stress incontinence (female) (male): Secondary | ICD-10-CM | POA: Diagnosis not present

## 2016-05-26 DIAGNOSIS — H2589 Other age-related cataract: Secondary | ICD-10-CM | POA: Diagnosis not present

## 2016-05-26 DIAGNOSIS — H2513 Age-related nuclear cataract, bilateral: Secondary | ICD-10-CM | POA: Diagnosis not present

## 2016-05-26 DIAGNOSIS — H524 Presbyopia: Secondary | ICD-10-CM | POA: Diagnosis not present

## 2016-06-21 DIAGNOSIS — N3000 Acute cystitis without hematuria: Secondary | ICD-10-CM | POA: Diagnosis not present

## 2016-07-02 DIAGNOSIS — K5900 Constipation, unspecified: Secondary | ICD-10-CM | POA: Diagnosis not present

## 2016-07-02 DIAGNOSIS — R10813 Right lower quadrant abdominal tenderness: Secondary | ICD-10-CM | POA: Diagnosis not present

## 2016-07-02 DIAGNOSIS — R1031 Right lower quadrant pain: Secondary | ICD-10-CM | POA: Diagnosis not present

## 2016-07-02 DIAGNOSIS — R109 Unspecified abdominal pain: Secondary | ICD-10-CM | POA: Diagnosis not present

## 2016-07-02 DIAGNOSIS — K2970 Gastritis, unspecified, without bleeding: Secondary | ICD-10-CM | POA: Diagnosis not present

## 2016-07-02 DIAGNOSIS — R1084 Generalized abdominal pain: Secondary | ICD-10-CM | POA: Diagnosis not present

## 2016-09-09 DIAGNOSIS — R109 Unspecified abdominal pain: Secondary | ICD-10-CM | POA: Diagnosis not present

## 2016-09-09 DIAGNOSIS — R1084 Generalized abdominal pain: Secondary | ICD-10-CM | POA: Diagnosis not present

## 2016-09-09 DIAGNOSIS — K2970 Gastritis, unspecified, without bleeding: Secondary | ICD-10-CM | POA: Diagnosis not present

## 2016-09-09 DIAGNOSIS — N200 Calculus of kidney: Secondary | ICD-10-CM | POA: Diagnosis not present

## 2016-09-09 DIAGNOSIS — R10817 Generalized abdominal tenderness: Secondary | ICD-10-CM | POA: Diagnosis not present

## 2016-10-11 DIAGNOSIS — G20 Parkinson's disease: Secondary | ICD-10-CM | POA: Diagnosis not present

## 2016-10-11 DIAGNOSIS — R419 Unspecified symptoms and signs involving cognitive functions and awareness: Secondary | ICD-10-CM | POA: Diagnosis not present

## 2016-10-11 DIAGNOSIS — R609 Edema, unspecified: Secondary | ICD-10-CM | POA: Diagnosis not present

## 2016-10-11 DIAGNOSIS — R5383 Other fatigue: Secondary | ICD-10-CM | POA: Diagnosis not present

## 2016-10-11 DIAGNOSIS — E782 Mixed hyperlipidemia: Secondary | ICD-10-CM | POA: Diagnosis not present

## 2016-10-11 DIAGNOSIS — I5022 Chronic systolic (congestive) heart failure: Secondary | ICD-10-CM | POA: Diagnosis not present

## 2016-10-11 DIAGNOSIS — Z9189 Other specified personal risk factors, not elsewhere classified: Secondary | ICD-10-CM | POA: Diagnosis not present

## 2016-10-11 DIAGNOSIS — G309 Alzheimer's disease, unspecified: Secondary | ICD-10-CM | POA: Diagnosis not present

## 2016-10-11 DIAGNOSIS — F331 Major depressive disorder, recurrent, moderate: Secondary | ICD-10-CM | POA: Diagnosis not present

## 2016-10-11 DIAGNOSIS — R5381 Other malaise: Secondary | ICD-10-CM | POA: Diagnosis not present

## 2016-10-11 DIAGNOSIS — Z8673 Personal history of transient ischemic attack (TIA), and cerebral infarction without residual deficits: Secondary | ICD-10-CM | POA: Diagnosis not present

## 2016-10-11 DIAGNOSIS — M810 Age-related osteoporosis without current pathological fracture: Secondary | ICD-10-CM | POA: Diagnosis not present

## 2016-10-11 DIAGNOSIS — M150 Primary generalized (osteo)arthritis: Secondary | ICD-10-CM | POA: Diagnosis not present

## 2016-10-13 DIAGNOSIS — Z7902 Long term (current) use of antithrombotics/antiplatelets: Secondary | ICD-10-CM | POA: Diagnosis not present

## 2016-10-13 DIAGNOSIS — R079 Chest pain, unspecified: Secondary | ICD-10-CM | POA: Diagnosis not present

## 2016-10-13 DIAGNOSIS — R251 Tremor, unspecified: Secondary | ICD-10-CM | POA: Diagnosis not present

## 2016-10-13 DIAGNOSIS — Z79899 Other long term (current) drug therapy: Secondary | ICD-10-CM | POA: Diagnosis not present

## 2016-10-13 DIAGNOSIS — I10 Essential (primary) hypertension: Secondary | ICD-10-CM | POA: Diagnosis not present

## 2016-10-13 DIAGNOSIS — Z8673 Personal history of transient ischemic attack (TIA), and cerebral infarction without residual deficits: Secondary | ICD-10-CM | POA: Diagnosis not present

## 2016-10-26 DIAGNOSIS — Z79899 Other long term (current) drug therapy: Secondary | ICD-10-CM | POA: Diagnosis not present

## 2016-10-26 DIAGNOSIS — Z7982 Long term (current) use of aspirin: Secondary | ICD-10-CM | POA: Diagnosis not present

## 2016-10-26 DIAGNOSIS — R531 Weakness: Secondary | ICD-10-CM | POA: Diagnosis not present

## 2016-10-26 DIAGNOSIS — Z8673 Personal history of transient ischemic attack (TIA), and cerebral infarction without residual deficits: Secondary | ICD-10-CM | POA: Diagnosis not present

## 2016-10-26 DIAGNOSIS — F0391 Unspecified dementia with behavioral disturbance: Secondary | ICD-10-CM | POA: Diagnosis not present

## 2016-10-26 DIAGNOSIS — Z9183 Wandering in diseases classified elsewhere: Secondary | ICD-10-CM | POA: Diagnosis not present

## 2016-10-26 DIAGNOSIS — Z7901 Long term (current) use of anticoagulants: Secondary | ICD-10-CM | POA: Diagnosis not present

## 2016-10-26 DIAGNOSIS — F4489 Other dissociative and conversion disorders: Secondary | ICD-10-CM | POA: Diagnosis not present

## 2016-10-26 DIAGNOSIS — I6789 Other cerebrovascular disease: Secondary | ICD-10-CM | POA: Diagnosis not present

## 2016-10-26 DIAGNOSIS — R4182 Altered mental status, unspecified: Secondary | ICD-10-CM | POA: Diagnosis not present

## 2016-10-26 DIAGNOSIS — I10 Essential (primary) hypertension: Secondary | ICD-10-CM | POA: Diagnosis not present

## 2016-11-09 DIAGNOSIS — M6281 Muscle weakness (generalized): Secondary | ICD-10-CM | POA: Diagnosis not present

## 2016-11-09 DIAGNOSIS — Z6823 Body mass index (BMI) 23.0-23.9, adult: Secondary | ICD-10-CM | POA: Diagnosis not present

## 2016-11-09 DIAGNOSIS — M25511 Pain in right shoulder: Secondary | ICD-10-CM | POA: Diagnosis not present

## 2016-11-09 DIAGNOSIS — F339 Major depressive disorder, recurrent, unspecified: Secondary | ICD-10-CM | POA: Diagnosis not present

## 2016-11-09 DIAGNOSIS — I10 Essential (primary) hypertension: Secondary | ICD-10-CM | POA: Diagnosis not present

## 2016-11-09 DIAGNOSIS — R269 Unspecified abnormalities of gait and mobility: Secondary | ICD-10-CM | POA: Diagnosis not present

## 2016-11-09 DIAGNOSIS — G309 Alzheimer's disease, unspecified: Secondary | ICD-10-CM | POA: Diagnosis not present

## 2016-11-10 DIAGNOSIS — Z79899 Other long term (current) drug therapy: Secondary | ICD-10-CM | POA: Diagnosis not present

## 2016-11-10 DIAGNOSIS — G309 Alzheimer's disease, unspecified: Secondary | ICD-10-CM | POA: Diagnosis not present

## 2016-11-10 DIAGNOSIS — F339 Major depressive disorder, recurrent, unspecified: Secondary | ICD-10-CM | POA: Diagnosis not present

## 2016-11-14 DIAGNOSIS — E559 Vitamin D deficiency, unspecified: Secondary | ICD-10-CM | POA: Diagnosis not present

## 2016-11-14 DIAGNOSIS — R54 Age-related physical debility: Secondary | ICD-10-CM | POA: Diagnosis not present

## 2016-11-17 DIAGNOSIS — Z79899 Other long term (current) drug therapy: Secondary | ICD-10-CM | POA: Diagnosis not present

## 2016-11-18 DIAGNOSIS — G309 Alzheimer's disease, unspecified: Secondary | ICD-10-CM | POA: Diagnosis not present

## 2016-11-18 DIAGNOSIS — F339 Major depressive disorder, recurrent, unspecified: Secondary | ICD-10-CM | POA: Diagnosis not present

## 2016-11-23 DIAGNOSIS — G309 Alzheimer's disease, unspecified: Secondary | ICD-10-CM | POA: Diagnosis not present

## 2016-11-23 DIAGNOSIS — F339 Major depressive disorder, recurrent, unspecified: Secondary | ICD-10-CM | POA: Diagnosis not present

## 2016-11-25 DIAGNOSIS — L609 Nail disorder, unspecified: Secondary | ICD-10-CM | POA: Diagnosis not present

## 2016-11-30 DIAGNOSIS — F339 Major depressive disorder, recurrent, unspecified: Secondary | ICD-10-CM | POA: Diagnosis not present

## 2016-11-30 DIAGNOSIS — G309 Alzheimer's disease, unspecified: Secondary | ICD-10-CM | POA: Diagnosis not present

## 2016-12-05 DIAGNOSIS — R21 Rash and other nonspecific skin eruption: Secondary | ICD-10-CM | POA: Diagnosis not present

## 2016-12-05 DIAGNOSIS — L989 Disorder of the skin and subcutaneous tissue, unspecified: Secondary | ICD-10-CM | POA: Diagnosis not present

## 2016-12-05 DIAGNOSIS — R54 Age-related physical debility: Secondary | ICD-10-CM | POA: Diagnosis not present

## 2016-12-07 DIAGNOSIS — F339 Major depressive disorder, recurrent, unspecified: Secondary | ICD-10-CM | POA: Diagnosis not present

## 2016-12-07 DIAGNOSIS — G309 Alzheimer's disease, unspecified: Secondary | ICD-10-CM | POA: Diagnosis not present

## 2016-12-08 DIAGNOSIS — G309 Alzheimer's disease, unspecified: Secondary | ICD-10-CM | POA: Diagnosis not present

## 2016-12-08 DIAGNOSIS — F29 Unspecified psychosis not due to a substance or known physiological condition: Secondary | ICD-10-CM | POA: Diagnosis not present

## 2016-12-08 DIAGNOSIS — F339 Major depressive disorder, recurrent, unspecified: Secondary | ICD-10-CM | POA: Diagnosis not present

## 2016-12-15 DIAGNOSIS — Z79899 Other long term (current) drug therapy: Secondary | ICD-10-CM | POA: Diagnosis not present

## 2016-12-22 DIAGNOSIS — F339 Major depressive disorder, recurrent, unspecified: Secondary | ICD-10-CM | POA: Diagnosis not present

## 2016-12-22 DIAGNOSIS — F29 Unspecified psychosis not due to a substance or known physiological condition: Secondary | ICD-10-CM | POA: Diagnosis not present

## 2016-12-22 DIAGNOSIS — G309 Alzheimer's disease, unspecified: Secondary | ICD-10-CM | POA: Diagnosis not present

## 2016-12-29 DIAGNOSIS — Z79899 Other long term (current) drug therapy: Secondary | ICD-10-CM | POA: Diagnosis not present

## 2016-12-30 DIAGNOSIS — G309 Alzheimer's disease, unspecified: Secondary | ICD-10-CM | POA: Diagnosis not present

## 2016-12-30 DIAGNOSIS — F339 Major depressive disorder, recurrent, unspecified: Secondary | ICD-10-CM | POA: Diagnosis not present

## 2017-01-02 DIAGNOSIS — R54 Age-related physical debility: Secondary | ICD-10-CM | POA: Diagnosis not present

## 2017-01-02 DIAGNOSIS — R3981 Functional urinary incontinence: Secondary | ICD-10-CM | POA: Diagnosis not present

## 2017-01-02 DIAGNOSIS — G309 Alzheimer's disease, unspecified: Secondary | ICD-10-CM | POA: Diagnosis not present

## 2017-01-04 DIAGNOSIS — G309 Alzheimer's disease, unspecified: Secondary | ICD-10-CM | POA: Diagnosis not present

## 2017-01-04 DIAGNOSIS — F339 Major depressive disorder, recurrent, unspecified: Secondary | ICD-10-CM | POA: Diagnosis not present

## 2017-01-05 DIAGNOSIS — F339 Major depressive disorder, recurrent, unspecified: Secondary | ICD-10-CM | POA: Diagnosis not present

## 2017-01-05 DIAGNOSIS — F29 Unspecified psychosis not due to a substance or known physiological condition: Secondary | ICD-10-CM | POA: Diagnosis not present

## 2017-01-05 DIAGNOSIS — G309 Alzheimer's disease, unspecified: Secondary | ICD-10-CM | POA: Diagnosis not present

## 2017-01-12 DIAGNOSIS — E559 Vitamin D deficiency, unspecified: Secondary | ICD-10-CM | POA: Diagnosis not present

## 2017-01-13 DIAGNOSIS — G309 Alzheimer's disease, unspecified: Secondary | ICD-10-CM | POA: Diagnosis not present

## 2017-01-13 DIAGNOSIS — F339 Major depressive disorder, recurrent, unspecified: Secondary | ICD-10-CM | POA: Diagnosis not present

## 2017-01-16 DIAGNOSIS — E559 Vitamin D deficiency, unspecified: Secondary | ICD-10-CM | POA: Diagnosis not present

## 2017-01-18 DIAGNOSIS — F339 Major depressive disorder, recurrent, unspecified: Secondary | ICD-10-CM | POA: Diagnosis not present

## 2017-01-18 DIAGNOSIS — G309 Alzheimer's disease, unspecified: Secondary | ICD-10-CM | POA: Diagnosis not present

## 2017-02-01 DIAGNOSIS — G309 Alzheimer's disease, unspecified: Secondary | ICD-10-CM | POA: Diagnosis not present

## 2017-02-01 DIAGNOSIS — F339 Major depressive disorder, recurrent, unspecified: Secondary | ICD-10-CM | POA: Diagnosis not present

## 2017-02-02 DIAGNOSIS — F339 Major depressive disorder, recurrent, unspecified: Secondary | ICD-10-CM | POA: Diagnosis not present

## 2017-02-02 DIAGNOSIS — G309 Alzheimer's disease, unspecified: Secondary | ICD-10-CM | POA: Diagnosis not present

## 2017-02-02 DIAGNOSIS — F29 Unspecified psychosis not due to a substance or known physiological condition: Secondary | ICD-10-CM | POA: Diagnosis not present

## 2017-02-15 DIAGNOSIS — F339 Major depressive disorder, recurrent, unspecified: Secondary | ICD-10-CM | POA: Diagnosis not present

## 2017-02-15 DIAGNOSIS — G309 Alzheimer's disease, unspecified: Secondary | ICD-10-CM | POA: Diagnosis not present

## 2017-02-20 DIAGNOSIS — R600 Localized edema: Secondary | ICD-10-CM | POA: Diagnosis not present

## 2017-02-20 DIAGNOSIS — R32 Unspecified urinary incontinence: Secondary | ICD-10-CM | POA: Diagnosis not present

## 2017-02-20 DIAGNOSIS — N819 Female genital prolapse, unspecified: Secondary | ICD-10-CM | POA: Diagnosis not present

## 2017-03-01 DIAGNOSIS — G309 Alzheimer's disease, unspecified: Secondary | ICD-10-CM | POA: Diagnosis not present

## 2017-03-01 DIAGNOSIS — F339 Major depressive disorder, recurrent, unspecified: Secondary | ICD-10-CM | POA: Diagnosis not present

## 2017-03-02 DIAGNOSIS — F29 Unspecified psychosis not due to a substance or known physiological condition: Secondary | ICD-10-CM | POA: Diagnosis not present

## 2017-03-02 DIAGNOSIS — F339 Major depressive disorder, recurrent, unspecified: Secondary | ICD-10-CM | POA: Diagnosis not present

## 2017-03-02 DIAGNOSIS — G309 Alzheimer's disease, unspecified: Secondary | ICD-10-CM | POA: Diagnosis not present

## 2017-03-03 DIAGNOSIS — R3981 Functional urinary incontinence: Secondary | ICD-10-CM | POA: Diagnosis not present

## 2017-03-03 DIAGNOSIS — G309 Alzheimer's disease, unspecified: Secondary | ICD-10-CM | POA: Diagnosis not present

## 2017-03-03 DIAGNOSIS — F419 Anxiety disorder, unspecified: Secondary | ICD-10-CM | POA: Diagnosis not present

## 2017-03-03 DIAGNOSIS — I10 Essential (primary) hypertension: Secondary | ICD-10-CM | POA: Diagnosis not present

## 2017-03-08 DIAGNOSIS — F339 Major depressive disorder, recurrent, unspecified: Secondary | ICD-10-CM | POA: Diagnosis not present

## 2017-03-08 DIAGNOSIS — G309 Alzheimer's disease, unspecified: Secondary | ICD-10-CM | POA: Diagnosis not present

## 2017-03-09 DIAGNOSIS — G309 Alzheimer's disease, unspecified: Secondary | ICD-10-CM | POA: Diagnosis not present

## 2017-03-09 DIAGNOSIS — F29 Unspecified psychosis not due to a substance or known physiological condition: Secondary | ICD-10-CM | POA: Diagnosis not present

## 2017-03-09 DIAGNOSIS — F339 Major depressive disorder, recurrent, unspecified: Secondary | ICD-10-CM | POA: Diagnosis not present

## 2017-03-15 DIAGNOSIS — G309 Alzheimer's disease, unspecified: Secondary | ICD-10-CM | POA: Diagnosis not present

## 2017-03-15 DIAGNOSIS — F339 Major depressive disorder, recurrent, unspecified: Secondary | ICD-10-CM | POA: Diagnosis not present

## 2017-03-20 DIAGNOSIS — G309 Alzheimer's disease, unspecified: Secondary | ICD-10-CM | POA: Diagnosis not present

## 2017-03-20 DIAGNOSIS — R634 Abnormal weight loss: Secondary | ICD-10-CM | POA: Diagnosis not present

## 2017-03-20 DIAGNOSIS — R54 Age-related physical debility: Secondary | ICD-10-CM | POA: Diagnosis not present

## 2017-03-30 DIAGNOSIS — F29 Unspecified psychosis not due to a substance or known physiological condition: Secondary | ICD-10-CM | POA: Diagnosis not present

## 2017-03-30 DIAGNOSIS — F339 Major depressive disorder, recurrent, unspecified: Secondary | ICD-10-CM | POA: Diagnosis not present

## 2017-03-30 DIAGNOSIS — G309 Alzheimer's disease, unspecified: Secondary | ICD-10-CM | POA: Diagnosis not present

## 2017-04-01 DIAGNOSIS — F339 Major depressive disorder, recurrent, unspecified: Secondary | ICD-10-CM | POA: Diagnosis not present

## 2017-04-01 DIAGNOSIS — G309 Alzheimer's disease, unspecified: Secondary | ICD-10-CM | POA: Diagnosis not present

## 2017-04-05 DIAGNOSIS — F339 Major depressive disorder, recurrent, unspecified: Secondary | ICD-10-CM | POA: Diagnosis not present

## 2017-04-05 DIAGNOSIS — G309 Alzheimer's disease, unspecified: Secondary | ICD-10-CM | POA: Diagnosis not present

## 2017-04-06 DIAGNOSIS — Z79899 Other long term (current) drug therapy: Secondary | ICD-10-CM | POA: Diagnosis not present

## 2017-04-10 DIAGNOSIS — E876 Hypokalemia: Secondary | ICD-10-CM | POA: Diagnosis not present

## 2017-04-10 DIAGNOSIS — R54 Age-related physical debility: Secondary | ICD-10-CM | POA: Diagnosis not present

## 2017-04-10 DIAGNOSIS — G309 Alzheimer's disease, unspecified: Secondary | ICD-10-CM | POA: Diagnosis not present

## 2017-04-20 DIAGNOSIS — E786 Lipoprotein deficiency: Secondary | ICD-10-CM | POA: Diagnosis not present

## 2017-04-21 DIAGNOSIS — G309 Alzheimer's disease, unspecified: Secondary | ICD-10-CM | POA: Diagnosis not present

## 2017-04-21 DIAGNOSIS — E876 Hypokalemia: Secondary | ICD-10-CM | POA: Diagnosis not present

## 2017-04-26 DIAGNOSIS — G309 Alzheimer's disease, unspecified: Secondary | ICD-10-CM | POA: Diagnosis not present

## 2017-04-26 DIAGNOSIS — F339 Major depressive disorder, recurrent, unspecified: Secondary | ICD-10-CM | POA: Diagnosis not present

## 2017-04-27 DIAGNOSIS — F339 Major depressive disorder, recurrent, unspecified: Secondary | ICD-10-CM | POA: Diagnosis not present

## 2017-04-27 DIAGNOSIS — F29 Unspecified psychosis not due to a substance or known physiological condition: Secondary | ICD-10-CM | POA: Diagnosis not present

## 2017-04-27 DIAGNOSIS — I10 Essential (primary) hypertension: Secondary | ICD-10-CM | POA: Diagnosis not present

## 2017-04-27 DIAGNOSIS — G309 Alzheimer's disease, unspecified: Secondary | ICD-10-CM | POA: Diagnosis not present

## 2017-05-17 DIAGNOSIS — F339 Major depressive disorder, recurrent, unspecified: Secondary | ICD-10-CM | POA: Diagnosis not present

## 2017-05-17 DIAGNOSIS — G309 Alzheimer's disease, unspecified: Secondary | ICD-10-CM | POA: Diagnosis not present

## 2017-05-24 DIAGNOSIS — F339 Major depressive disorder, recurrent, unspecified: Secondary | ICD-10-CM | POA: Diagnosis not present

## 2017-05-24 DIAGNOSIS — G309 Alzheimer's disease, unspecified: Secondary | ICD-10-CM | POA: Diagnosis not present

## 2017-05-25 DIAGNOSIS — G309 Alzheimer's disease, unspecified: Secondary | ICD-10-CM | POA: Diagnosis not present

## 2017-05-25 DIAGNOSIS — F29 Unspecified psychosis not due to a substance or known physiological condition: Secondary | ICD-10-CM | POA: Diagnosis not present

## 2017-05-25 DIAGNOSIS — F339 Major depressive disorder, recurrent, unspecified: Secondary | ICD-10-CM | POA: Diagnosis not present

## 2017-05-29 DIAGNOSIS — R3981 Functional urinary incontinence: Secondary | ICD-10-CM | POA: Diagnosis not present

## 2017-05-29 DIAGNOSIS — G309 Alzheimer's disease, unspecified: Secondary | ICD-10-CM | POA: Diagnosis not present

## 2017-06-05 DIAGNOSIS — E876 Hypokalemia: Secondary | ICD-10-CM | POA: Diagnosis not present

## 2017-06-05 DIAGNOSIS — I10 Essential (primary) hypertension: Secondary | ICD-10-CM | POA: Diagnosis not present

## 2017-06-05 DIAGNOSIS — E559 Vitamin D deficiency, unspecified: Secondary | ICD-10-CM | POA: Diagnosis not present

## 2017-06-05 DIAGNOSIS — G309 Alzheimer's disease, unspecified: Secondary | ICD-10-CM | POA: Diagnosis not present

## 2017-06-07 DIAGNOSIS — F339 Major depressive disorder, recurrent, unspecified: Secondary | ICD-10-CM | POA: Diagnosis not present

## 2017-06-07 DIAGNOSIS — G309 Alzheimer's disease, unspecified: Secondary | ICD-10-CM | POA: Diagnosis not present

## 2017-06-08 DIAGNOSIS — Z79899 Other long term (current) drug therapy: Secondary | ICD-10-CM | POA: Diagnosis not present

## 2017-06-25 DIAGNOSIS — F339 Major depressive disorder, recurrent, unspecified: Secondary | ICD-10-CM | POA: Diagnosis not present

## 2017-06-25 DIAGNOSIS — G309 Alzheimer's disease, unspecified: Secondary | ICD-10-CM | POA: Diagnosis not present

## 2017-06-28 DIAGNOSIS — F339 Major depressive disorder, recurrent, unspecified: Secondary | ICD-10-CM | POA: Diagnosis not present

## 2017-06-28 DIAGNOSIS — G309 Alzheimer's disease, unspecified: Secondary | ICD-10-CM | POA: Diagnosis not present

## 2017-07-05 DIAGNOSIS — G309 Alzheimer's disease, unspecified: Secondary | ICD-10-CM | POA: Diagnosis not present

## 2017-07-05 DIAGNOSIS — F339 Major depressive disorder, recurrent, unspecified: Secondary | ICD-10-CM | POA: Diagnosis not present

## 2017-07-06 DIAGNOSIS — F29 Unspecified psychosis not due to a substance or known physiological condition: Secondary | ICD-10-CM | POA: Diagnosis not present

## 2017-07-06 DIAGNOSIS — G309 Alzheimer's disease, unspecified: Secondary | ICD-10-CM | POA: Diagnosis not present

## 2017-07-06 DIAGNOSIS — F339 Major depressive disorder, recurrent, unspecified: Secondary | ICD-10-CM | POA: Diagnosis not present

## 2017-07-12 DIAGNOSIS — F339 Major depressive disorder, recurrent, unspecified: Secondary | ICD-10-CM | POA: Diagnosis not present

## 2017-07-12 DIAGNOSIS — G309 Alzheimer's disease, unspecified: Secondary | ICD-10-CM | POA: Diagnosis not present

## 2017-07-13 DIAGNOSIS — G309 Alzheimer's disease, unspecified: Secondary | ICD-10-CM | POA: Diagnosis not present

## 2017-07-13 DIAGNOSIS — R54 Age-related physical debility: Secondary | ICD-10-CM | POA: Diagnosis not present

## 2017-07-13 DIAGNOSIS — Z9114 Patient's other noncompliance with medication regimen: Secondary | ICD-10-CM | POA: Diagnosis not present

## 2017-07-19 DIAGNOSIS — F339 Major depressive disorder, recurrent, unspecified: Secondary | ICD-10-CM | POA: Diagnosis not present

## 2017-07-19 DIAGNOSIS — G309 Alzheimer's disease, unspecified: Secondary | ICD-10-CM | POA: Diagnosis not present

## 2017-07-20 DIAGNOSIS — F339 Major depressive disorder, recurrent, unspecified: Secondary | ICD-10-CM | POA: Diagnosis not present

## 2017-07-20 DIAGNOSIS — F29 Unspecified psychosis not due to a substance or known physiological condition: Secondary | ICD-10-CM | POA: Diagnosis not present

## 2017-07-20 DIAGNOSIS — G309 Alzheimer's disease, unspecified: Secondary | ICD-10-CM | POA: Diagnosis not present

## 2017-07-28 DIAGNOSIS — G309 Alzheimer's disease, unspecified: Secondary | ICD-10-CM | POA: Diagnosis not present

## 2017-07-28 DIAGNOSIS — F339 Major depressive disorder, recurrent, unspecified: Secondary | ICD-10-CM | POA: Diagnosis not present

## 2017-08-02 DIAGNOSIS — G309 Alzheimer's disease, unspecified: Secondary | ICD-10-CM | POA: Diagnosis not present

## 2017-08-02 DIAGNOSIS — F339 Major depressive disorder, recurrent, unspecified: Secondary | ICD-10-CM | POA: Diagnosis not present

## 2017-08-04 DIAGNOSIS — G309 Alzheimer's disease, unspecified: Secondary | ICD-10-CM | POA: Diagnosis not present

## 2017-08-04 DIAGNOSIS — R3981 Functional urinary incontinence: Secondary | ICD-10-CM | POA: Diagnosis not present

## 2017-08-04 DIAGNOSIS — N819 Female genital prolapse, unspecified: Secondary | ICD-10-CM | POA: Diagnosis not present

## 2017-08-04 DIAGNOSIS — R54 Age-related physical debility: Secondary | ICD-10-CM | POA: Diagnosis not present

## 2017-08-07 DIAGNOSIS — N390 Urinary tract infection, site not specified: Secondary | ICD-10-CM | POA: Diagnosis not present

## 2017-08-07 DIAGNOSIS — R54 Age-related physical debility: Secondary | ICD-10-CM | POA: Diagnosis not present

## 2017-08-07 DIAGNOSIS — G309 Alzheimer's disease, unspecified: Secondary | ICD-10-CM | POA: Diagnosis not present

## 2017-08-07 DIAGNOSIS — R4182 Altered mental status, unspecified: Secondary | ICD-10-CM | POA: Diagnosis not present

## 2017-08-17 DIAGNOSIS — F0631 Mood disorder due to known physiological condition with depressive features: Secondary | ICD-10-CM | POA: Diagnosis not present

## 2017-08-17 DIAGNOSIS — F29 Unspecified psychosis not due to a substance or known physiological condition: Secondary | ICD-10-CM | POA: Diagnosis not present

## 2017-08-17 DIAGNOSIS — G309 Alzheimer's disease, unspecified: Secondary | ICD-10-CM | POA: Diagnosis not present

## 2017-08-17 DIAGNOSIS — F0150 Vascular dementia without behavioral disturbance: Secondary | ICD-10-CM | POA: Diagnosis not present

## 2017-08-19 DIAGNOSIS — G309 Alzheimer's disease, unspecified: Secondary | ICD-10-CM | POA: Diagnosis not present

## 2017-08-19 DIAGNOSIS — F339 Major depressive disorder, recurrent, unspecified: Secondary | ICD-10-CM | POA: Diagnosis not present

## 2017-08-24 DIAGNOSIS — F0631 Mood disorder due to known physiological condition with depressive features: Secondary | ICD-10-CM | POA: Diagnosis not present

## 2017-08-24 DIAGNOSIS — G309 Alzheimer's disease, unspecified: Secondary | ICD-10-CM | POA: Diagnosis not present

## 2017-08-24 DIAGNOSIS — F29 Unspecified psychosis not due to a substance or known physiological condition: Secondary | ICD-10-CM | POA: Diagnosis not present

## 2017-08-24 DIAGNOSIS — F0150 Vascular dementia without behavioral disturbance: Secondary | ICD-10-CM | POA: Diagnosis not present

## 2017-09-06 DIAGNOSIS — F339 Major depressive disorder, recurrent, unspecified: Secondary | ICD-10-CM | POA: Diagnosis not present

## 2017-09-06 DIAGNOSIS — G309 Alzheimer's disease, unspecified: Secondary | ICD-10-CM | POA: Diagnosis not present

## 2017-09-07 DIAGNOSIS — Z79899 Other long term (current) drug therapy: Secondary | ICD-10-CM | POA: Diagnosis not present

## 2017-09-11 DIAGNOSIS — E876 Hypokalemia: Secondary | ICD-10-CM | POA: Diagnosis not present

## 2017-09-11 DIAGNOSIS — E559 Vitamin D deficiency, unspecified: Secondary | ICD-10-CM | POA: Diagnosis not present

## 2017-09-11 DIAGNOSIS — I10 Essential (primary) hypertension: Secondary | ICD-10-CM | POA: Diagnosis not present

## 2017-09-11 DIAGNOSIS — G309 Alzheimer's disease, unspecified: Secondary | ICD-10-CM | POA: Diagnosis not present

## 2017-09-13 DIAGNOSIS — R600 Localized edema: Secondary | ICD-10-CM | POA: Diagnosis not present

## 2017-09-13 DIAGNOSIS — G309 Alzheimer's disease, unspecified: Secondary | ICD-10-CM | POA: Diagnosis not present

## 2017-09-13 DIAGNOSIS — F339 Major depressive disorder, recurrent, unspecified: Secondary | ICD-10-CM | POA: Diagnosis not present

## 2017-09-13 DIAGNOSIS — R54 Age-related physical debility: Secondary | ICD-10-CM | POA: Diagnosis not present

## 2017-09-13 DIAGNOSIS — E876 Hypokalemia: Secondary | ICD-10-CM | POA: Diagnosis not present

## 2017-09-14 DIAGNOSIS — Z79899 Other long term (current) drug therapy: Secondary | ICD-10-CM | POA: Diagnosis not present

## 2017-09-15 DIAGNOSIS — G309 Alzheimer's disease, unspecified: Secondary | ICD-10-CM | POA: Diagnosis not present

## 2017-09-15 DIAGNOSIS — L609 Nail disorder, unspecified: Secondary | ICD-10-CM | POA: Diagnosis not present

## 2017-09-15 DIAGNOSIS — R600 Localized edema: Secondary | ICD-10-CM | POA: Diagnosis not present

## 2017-09-15 DIAGNOSIS — E559 Vitamin D deficiency, unspecified: Secondary | ICD-10-CM | POA: Diagnosis not present

## 2017-09-15 DIAGNOSIS — I509 Heart failure, unspecified: Secondary | ICD-10-CM | POA: Diagnosis not present

## 2017-09-21 DIAGNOSIS — E559 Vitamin D deficiency, unspecified: Secondary | ICD-10-CM | POA: Diagnosis not present

## 2017-09-27 DIAGNOSIS — G309 Alzheimer's disease, unspecified: Secondary | ICD-10-CM | POA: Diagnosis not present

## 2017-09-27 DIAGNOSIS — F339 Major depressive disorder, recurrent, unspecified: Secondary | ICD-10-CM | POA: Diagnosis not present

## 2017-09-28 DIAGNOSIS — G309 Alzheimer's disease, unspecified: Secondary | ICD-10-CM | POA: Diagnosis not present

## 2017-09-28 DIAGNOSIS — F29 Unspecified psychosis not due to a substance or known physiological condition: Secondary | ICD-10-CM | POA: Diagnosis not present

## 2017-09-28 DIAGNOSIS — F0150 Vascular dementia without behavioral disturbance: Secondary | ICD-10-CM | POA: Diagnosis not present

## 2017-09-28 DIAGNOSIS — F0631 Mood disorder due to known physiological condition with depressive features: Secondary | ICD-10-CM | POA: Diagnosis not present

## 2017-10-11 DIAGNOSIS — G309 Alzheimer's disease, unspecified: Secondary | ICD-10-CM | POA: Diagnosis not present

## 2017-10-11 DIAGNOSIS — F339 Major depressive disorder, recurrent, unspecified: Secondary | ICD-10-CM | POA: Diagnosis not present

## 2017-10-18 DIAGNOSIS — G309 Alzheimer's disease, unspecified: Secondary | ICD-10-CM | POA: Diagnosis not present

## 2017-10-18 DIAGNOSIS — F339 Major depressive disorder, recurrent, unspecified: Secondary | ICD-10-CM | POA: Diagnosis not present

## 2017-11-09 DIAGNOSIS — R4182 Altered mental status, unspecified: Secondary | ICD-10-CM | POA: Diagnosis not present

## 2017-11-09 DIAGNOSIS — E559 Vitamin D deficiency, unspecified: Secondary | ICD-10-CM | POA: Diagnosis not present

## 2017-11-09 DIAGNOSIS — R569 Unspecified convulsions: Secondary | ICD-10-CM | POA: Diagnosis not present

## 2017-11-09 DIAGNOSIS — Z0000 Encounter for general adult medical examination without abnormal findings: Secondary | ICD-10-CM | POA: Diagnosis not present

## 2017-11-09 DIAGNOSIS — F0280 Dementia in other diseases classified elsewhere without behavioral disturbance: Secondary | ICD-10-CM | POA: Diagnosis not present

## 2017-11-09 DIAGNOSIS — R51 Headache: Secondary | ICD-10-CM | POA: Diagnosis not present

## 2017-11-09 DIAGNOSIS — G309 Alzheimer's disease, unspecified: Secondary | ICD-10-CM | POA: Diagnosis not present

## 2017-11-09 DIAGNOSIS — R251 Tremor, unspecified: Secondary | ICD-10-CM | POA: Diagnosis not present

## 2017-11-09 DIAGNOSIS — R1030 Lower abdominal pain, unspecified: Secondary | ICD-10-CM | POA: Diagnosis not present

## 2017-11-09 DIAGNOSIS — N3289 Other specified disorders of bladder: Secondary | ICD-10-CM | POA: Diagnosis not present

## 2017-11-09 DIAGNOSIS — R109 Unspecified abdominal pain: Secondary | ICD-10-CM | POA: Diagnosis not present

## 2017-11-10 DIAGNOSIS — R54 Age-related physical debility: Secondary | ICD-10-CM | POA: Diagnosis not present

## 2017-11-10 DIAGNOSIS — G309 Alzheimer's disease, unspecified: Secondary | ICD-10-CM | POA: Diagnosis not present

## 2017-11-10 DIAGNOSIS — R569 Unspecified convulsions: Secondary | ICD-10-CM | POA: Diagnosis not present

## 2017-11-10 DIAGNOSIS — I509 Heart failure, unspecified: Secondary | ICD-10-CM | POA: Diagnosis not present

## 2017-11-13 DIAGNOSIS — R799 Abnormal finding of blood chemistry, unspecified: Secondary | ICD-10-CM | POA: Diagnosis not present

## 2017-11-13 DIAGNOSIS — E559 Vitamin D deficiency, unspecified: Secondary | ICD-10-CM | POA: Diagnosis not present

## 2017-11-13 DIAGNOSIS — G309 Alzheimer's disease, unspecified: Secondary | ICD-10-CM | POA: Diagnosis not present

## 2017-12-13 DIAGNOSIS — I10 Essential (primary) hypertension: Secondary | ICD-10-CM | POA: Diagnosis not present

## 2017-12-13 DIAGNOSIS — R569 Unspecified convulsions: Secondary | ICD-10-CM | POA: Diagnosis not present

## 2017-12-13 DIAGNOSIS — R600 Localized edema: Secondary | ICD-10-CM | POA: Diagnosis not present

## 2017-12-13 DIAGNOSIS — I509 Heart failure, unspecified: Secondary | ICD-10-CM | POA: Diagnosis not present

## 2017-12-14 DIAGNOSIS — Z79899 Other long term (current) drug therapy: Secondary | ICD-10-CM | POA: Diagnosis not present

## 2017-12-20 DIAGNOSIS — R54 Age-related physical debility: Secondary | ICD-10-CM | POA: Diagnosis not present

## 2017-12-20 DIAGNOSIS — I509 Heart failure, unspecified: Secondary | ICD-10-CM | POA: Diagnosis not present

## 2017-12-20 DIAGNOSIS — R600 Localized edema: Secondary | ICD-10-CM | POA: Diagnosis not present

## 2017-12-20 DIAGNOSIS — G309 Alzheimer's disease, unspecified: Secondary | ICD-10-CM | POA: Diagnosis not present

## 2018-01-03 DIAGNOSIS — R54 Age-related physical debility: Secondary | ICD-10-CM | POA: Diagnosis not present

## 2018-01-03 DIAGNOSIS — R269 Unspecified abnormalities of gait and mobility: Secondary | ICD-10-CM | POA: Diagnosis not present

## 2018-01-03 DIAGNOSIS — G309 Alzheimer's disease, unspecified: Secondary | ICD-10-CM | POA: Diagnosis not present

## 2018-01-03 DIAGNOSIS — Z8673 Personal history of transient ischemic attack (TIA), and cerebral infarction without residual deficits: Secondary | ICD-10-CM | POA: Diagnosis not present

## 2018-01-05 DIAGNOSIS — L609 Nail disorder, unspecified: Secondary | ICD-10-CM | POA: Diagnosis not present

## 2018-01-10 DIAGNOSIS — S5012XA Contusion of left forearm, initial encounter: Secondary | ICD-10-CM | POA: Diagnosis not present

## 2018-01-10 DIAGNOSIS — G309 Alzheimer's disease, unspecified: Secondary | ICD-10-CM | POA: Diagnosis not present

## 2018-01-10 DIAGNOSIS — E559 Vitamin D deficiency, unspecified: Secondary | ICD-10-CM | POA: Diagnosis not present

## 2018-01-10 DIAGNOSIS — R54 Age-related physical debility: Secondary | ICD-10-CM | POA: Diagnosis not present

## 2018-01-16 DIAGNOSIS — R54 Age-related physical debility: Secondary | ICD-10-CM | POA: Diagnosis not present

## 2018-01-16 DIAGNOSIS — G309 Alzheimer's disease, unspecified: Secondary | ICD-10-CM | POA: Diagnosis not present

## 2018-01-16 DIAGNOSIS — R635 Abnormal weight gain: Secondary | ICD-10-CM | POA: Diagnosis not present

## 2018-01-16 DIAGNOSIS — Z6822 Body mass index (BMI) 22.0-22.9, adult: Secondary | ICD-10-CM | POA: Diagnosis not present

## 2018-01-26 DIAGNOSIS — G309 Alzheimer's disease, unspecified: Secondary | ICD-10-CM | POA: Diagnosis not present

## 2018-01-26 DIAGNOSIS — R54 Age-related physical debility: Secondary | ICD-10-CM | POA: Diagnosis not present

## 2018-01-26 DIAGNOSIS — R600 Localized edema: Secondary | ICD-10-CM | POA: Diagnosis not present

## 2018-01-26 DIAGNOSIS — I509 Heart failure, unspecified: Secondary | ICD-10-CM | POA: Diagnosis not present

## 2018-03-21 DIAGNOSIS — R600 Localized edema: Secondary | ICD-10-CM | POA: Diagnosis not present

## 2018-03-21 DIAGNOSIS — I10 Essential (primary) hypertension: Secondary | ICD-10-CM | POA: Diagnosis not present

## 2018-03-21 DIAGNOSIS — I509 Heart failure, unspecified: Secondary | ICD-10-CM | POA: Diagnosis not present

## 2018-03-21 DIAGNOSIS — R569 Unspecified convulsions: Secondary | ICD-10-CM | POA: Diagnosis not present

## 2018-03-26 DIAGNOSIS — G309 Alzheimer's disease, unspecified: Secondary | ICD-10-CM | POA: Diagnosis not present

## 2018-03-26 DIAGNOSIS — Z87448 Personal history of other diseases of urinary system: Secondary | ICD-10-CM | POA: Diagnosis not present

## 2018-03-26 DIAGNOSIS — R300 Dysuria: Secondary | ICD-10-CM | POA: Diagnosis not present

## 2018-03-26 DIAGNOSIS — L22 Diaper dermatitis: Secondary | ICD-10-CM | POA: Diagnosis not present

## 2018-03-27 DIAGNOSIS — N390 Urinary tract infection, site not specified: Secondary | ICD-10-CM | POA: Diagnosis not present

## 2018-04-12 DIAGNOSIS — Z9851 Tubal ligation status: Secondary | ICD-10-CM | POA: Diagnosis not present

## 2018-04-12 DIAGNOSIS — R54 Age-related physical debility: Secondary | ICD-10-CM | POA: Diagnosis not present

## 2018-04-12 DIAGNOSIS — F0280 Dementia in other diseases classified elsewhere without behavioral disturbance: Secondary | ICD-10-CM | POA: Diagnosis not present

## 2018-04-12 DIAGNOSIS — R1111 Vomiting without nausea: Secondary | ICD-10-CM | POA: Diagnosis not present

## 2018-04-12 DIAGNOSIS — G309 Alzheimer's disease, unspecified: Secondary | ICD-10-CM | POA: Diagnosis not present

## 2018-04-12 DIAGNOSIS — R1030 Lower abdominal pain, unspecified: Secondary | ICD-10-CM | POA: Diagnosis not present

## 2018-04-12 DIAGNOSIS — Z79899 Other long term (current) drug therapy: Secondary | ICD-10-CM | POA: Diagnosis not present

## 2018-04-12 DIAGNOSIS — F418 Other specified anxiety disorders: Secondary | ICD-10-CM | POA: Diagnosis not present

## 2018-04-12 DIAGNOSIS — R112 Nausea with vomiting, unspecified: Secondary | ICD-10-CM | POA: Diagnosis not present

## 2018-04-12 DIAGNOSIS — N329 Bladder disorder, unspecified: Secondary | ICD-10-CM | POA: Diagnosis not present

## 2018-04-12 DIAGNOSIS — Z8739 Personal history of other diseases of the musculoskeletal system and connective tissue: Secondary | ICD-10-CM | POA: Diagnosis not present

## 2018-04-12 DIAGNOSIS — R1110 Vomiting, unspecified: Secondary | ICD-10-CM | POA: Diagnosis not present

## 2018-04-12 DIAGNOSIS — I10 Essential (primary) hypertension: Secondary | ICD-10-CM | POA: Diagnosis not present

## 2018-04-23 DIAGNOSIS — R54 Age-related physical debility: Secondary | ICD-10-CM | POA: Diagnosis not present

## 2018-04-23 DIAGNOSIS — G309 Alzheimer's disease, unspecified: Secondary | ICD-10-CM | POA: Diagnosis not present

## 2018-04-23 DIAGNOSIS — R269 Unspecified abnormalities of gait and mobility: Secondary | ICD-10-CM | POA: Diagnosis not present

## 2018-04-23 DIAGNOSIS — M6281 Muscle weakness (generalized): Secondary | ICD-10-CM | POA: Diagnosis not present

## 2018-04-27 DIAGNOSIS — R569 Unspecified convulsions: Secondary | ICD-10-CM | POA: Diagnosis not present

## 2018-04-27 DIAGNOSIS — R6889 Other general symptoms and signs: Secondary | ICD-10-CM | POA: Diagnosis not present

## 2018-04-27 DIAGNOSIS — F0280 Dementia in other diseases classified elsewhere without behavioral disturbance: Secondary | ICD-10-CM | POA: Diagnosis not present

## 2018-04-27 DIAGNOSIS — N390 Urinary tract infection, site not specified: Secondary | ICD-10-CM | POA: Diagnosis not present

## 2018-04-27 DIAGNOSIS — R55 Syncope and collapse: Secondary | ICD-10-CM | POA: Diagnosis not present

## 2018-04-27 DIAGNOSIS — R251 Tremor, unspecified: Secondary | ICD-10-CM | POA: Diagnosis not present

## 2018-04-27 DIAGNOSIS — R4182 Altered mental status, unspecified: Secondary | ICD-10-CM | POA: Diagnosis not present

## 2018-04-27 DIAGNOSIS — Z79899 Other long term (current) drug therapy: Secondary | ICD-10-CM | POA: Diagnosis not present

## 2018-04-27 DIAGNOSIS — G309 Alzheimer's disease, unspecified: Secondary | ICD-10-CM | POA: Diagnosis not present

## 2018-04-27 DIAGNOSIS — I10 Essential (primary) hypertension: Secondary | ICD-10-CM | POA: Diagnosis not present

## 2018-04-27 DIAGNOSIS — R531 Weakness: Secondary | ICD-10-CM | POA: Diagnosis not present

## 2018-04-27 DIAGNOSIS — R5383 Other fatigue: Secondary | ICD-10-CM | POA: Diagnosis not present

## 2018-04-30 DIAGNOSIS — R55 Syncope and collapse: Secondary | ICD-10-CM | POA: Diagnosis not present

## 2018-04-30 DIAGNOSIS — G309 Alzheimer's disease, unspecified: Secondary | ICD-10-CM | POA: Diagnosis not present

## 2018-04-30 DIAGNOSIS — N390 Urinary tract infection, site not specified: Secondary | ICD-10-CM | POA: Diagnosis not present

## 2018-04-30 DIAGNOSIS — G4700 Insomnia, unspecified: Secondary | ICD-10-CM | POA: Diagnosis not present

## 2018-05-02 DIAGNOSIS — Z23 Encounter for immunization: Secondary | ICD-10-CM | POA: Diagnosis not present

## 2018-05-18 DIAGNOSIS — L609 Nail disorder, unspecified: Secondary | ICD-10-CM | POA: Diagnosis not present

## 2018-06-11 DIAGNOSIS — R600 Localized edema: Secondary | ICD-10-CM | POA: Diagnosis not present

## 2018-06-11 DIAGNOSIS — I10 Essential (primary) hypertension: Secondary | ICD-10-CM | POA: Diagnosis not present

## 2018-06-11 DIAGNOSIS — I509 Heart failure, unspecified: Secondary | ICD-10-CM | POA: Diagnosis not present

## 2018-06-11 DIAGNOSIS — G309 Alzheimer's disease, unspecified: Secondary | ICD-10-CM | POA: Diagnosis not present

## 2018-06-13 DIAGNOSIS — Z79899 Other long term (current) drug therapy: Secondary | ICD-10-CM | POA: Diagnosis not present

## 2018-06-13 DIAGNOSIS — I10 Essential (primary) hypertension: Secondary | ICD-10-CM | POA: Diagnosis not present

## 2018-07-05 DIAGNOSIS — F339 Major depressive disorder, recurrent, unspecified: Secondary | ICD-10-CM | POA: Diagnosis not present

## 2018-07-05 DIAGNOSIS — I10 Essential (primary) hypertension: Secondary | ICD-10-CM | POA: Diagnosis not present

## 2018-07-05 DIAGNOSIS — I5022 Chronic systolic (congestive) heart failure: Secondary | ICD-10-CM | POA: Diagnosis not present

## 2018-07-05 DIAGNOSIS — R419 Unspecified symptoms and signs involving cognitive functions and awareness: Secondary | ICD-10-CM | POA: Diagnosis not present

## 2018-07-05 DIAGNOSIS — R269 Unspecified abnormalities of gait and mobility: Secondary | ICD-10-CM | POA: Diagnosis not present

## 2018-07-05 DIAGNOSIS — G309 Alzheimer's disease, unspecified: Secondary | ICD-10-CM | POA: Diagnosis not present

## 2018-08-12 DIAGNOSIS — S0990XA Unspecified injury of head, initial encounter: Secondary | ICD-10-CM | POA: Diagnosis not present

## 2018-08-12 DIAGNOSIS — Z66 Do not resuscitate: Secondary | ICD-10-CM | POA: Diagnosis not present

## 2018-08-12 DIAGNOSIS — M549 Dorsalgia, unspecified: Secondary | ICD-10-CM | POA: Diagnosis not present

## 2018-08-12 DIAGNOSIS — M546 Pain in thoracic spine: Secondary | ICD-10-CM | POA: Diagnosis not present

## 2018-08-12 DIAGNOSIS — G309 Alzheimer's disease, unspecified: Secondary | ICD-10-CM | POA: Diagnosis not present

## 2018-08-12 DIAGNOSIS — F0281 Dementia in other diseases classified elsewhere with behavioral disturbance: Secondary | ICD-10-CM | POA: Diagnosis not present

## 2018-08-12 DIAGNOSIS — I5032 Chronic diastolic (congestive) heart failure: Secondary | ICD-10-CM | POA: Diagnosis not present

## 2018-08-12 DIAGNOSIS — I5020 Unspecified systolic (congestive) heart failure: Secondary | ICD-10-CM | POA: Diagnosis not present

## 2018-08-12 DIAGNOSIS — F329 Major depressive disorder, single episode, unspecified: Secondary | ICD-10-CM | POA: Diagnosis not present

## 2018-08-12 DIAGNOSIS — S79911A Unspecified injury of right hip, initial encounter: Secondary | ICD-10-CM | POA: Diagnosis not present

## 2018-08-12 DIAGNOSIS — N6310 Unspecified lump in the right breast, unspecified quadrant: Secondary | ICD-10-CM | POA: Diagnosis not present

## 2018-08-12 DIAGNOSIS — R55 Syncope and collapse: Secondary | ICD-10-CM | POA: Diagnosis not present

## 2018-08-12 DIAGNOSIS — I252 Old myocardial infarction: Secondary | ICD-10-CM | POA: Diagnosis not present

## 2018-08-12 DIAGNOSIS — G20 Parkinson's disease: Secondary | ICD-10-CM | POA: Diagnosis not present

## 2018-08-12 DIAGNOSIS — E785 Hyperlipidemia, unspecified: Secondary | ICD-10-CM | POA: Diagnosis not present

## 2018-08-12 DIAGNOSIS — Z8673 Personal history of transient ischemic attack (TIA), and cerebral infarction without residual deficits: Secondary | ICD-10-CM | POA: Diagnosis not present

## 2018-08-12 DIAGNOSIS — R4182 Altered mental status, unspecified: Secondary | ICD-10-CM | POA: Diagnosis not present

## 2018-08-12 DIAGNOSIS — R001 Bradycardia, unspecified: Secondary | ICD-10-CM | POA: Diagnosis not present

## 2018-08-12 DIAGNOSIS — R531 Weakness: Secondary | ICD-10-CM | POA: Diagnosis not present

## 2018-08-12 DIAGNOSIS — S2241XA Multiple fractures of ribs, right side, initial encounter for closed fracture: Secondary | ICD-10-CM | POA: Diagnosis not present

## 2018-08-12 DIAGNOSIS — I5022 Chronic systolic (congestive) heart failure: Secondary | ICD-10-CM | POA: Diagnosis not present

## 2018-08-12 DIAGNOSIS — F419 Anxiety disorder, unspecified: Secondary | ICD-10-CM | POA: Diagnosis not present

## 2018-08-12 DIAGNOSIS — I361 Nonrheumatic tricuspid (valve) insufficiency: Secondary | ICD-10-CM | POA: Diagnosis not present

## 2018-08-12 DIAGNOSIS — M25551 Pain in right hip: Secondary | ICD-10-CM | POA: Diagnosis not present

## 2018-08-12 DIAGNOSIS — S199XXA Unspecified injury of neck, initial encounter: Secondary | ICD-10-CM | POA: Diagnosis not present

## 2018-08-12 DIAGNOSIS — M810 Age-related osteoporosis without current pathological fracture: Secondary | ICD-10-CM | POA: Diagnosis not present

## 2018-08-12 DIAGNOSIS — R278 Other lack of coordination: Secondary | ICD-10-CM | POA: Diagnosis not present

## 2018-08-12 DIAGNOSIS — I110 Hypertensive heart disease with heart failure: Secondary | ICD-10-CM | POA: Diagnosis not present

## 2018-08-13 DIAGNOSIS — I361 Nonrheumatic tricuspid (valve) insufficiency: Secondary | ICD-10-CM | POA: Diagnosis not present

## 2018-08-15 DIAGNOSIS — R278 Other lack of coordination: Secondary | ICD-10-CM | POA: Diagnosis not present

## 2018-08-15 DIAGNOSIS — R4182 Altered mental status, unspecified: Secondary | ICD-10-CM | POA: Diagnosis not present

## 2018-08-15 DIAGNOSIS — R531 Weakness: Secondary | ICD-10-CM | POA: Diagnosis not present

## 2018-08-17 DIAGNOSIS — S2241XA Multiple fractures of ribs, right side, initial encounter for closed fracture: Secondary | ICD-10-CM | POA: Diagnosis not present

## 2018-08-17 DIAGNOSIS — G309 Alzheimer's disease, unspecified: Secondary | ICD-10-CM | POA: Diagnosis not present

## 2018-08-17 DIAGNOSIS — I5022 Chronic systolic (congestive) heart failure: Secondary | ICD-10-CM | POA: Diagnosis not present

## 2018-08-17 DIAGNOSIS — R269 Unspecified abnormalities of gait and mobility: Secondary | ICD-10-CM | POA: Diagnosis not present

## 2018-08-20 DIAGNOSIS — S2241XD Multiple fractures of ribs, right side, subsequent encounter for fracture with routine healing: Secondary | ICD-10-CM | POA: Diagnosis not present

## 2018-08-29 DIAGNOSIS — R54 Age-related physical debility: Secondary | ICD-10-CM | POA: Diagnosis not present

## 2018-08-29 DIAGNOSIS — S2241XD Multiple fractures of ribs, right side, subsequent encounter for fracture with routine healing: Secondary | ICD-10-CM | POA: Diagnosis not present

## 2018-08-29 DIAGNOSIS — R279 Unspecified lack of coordination: Secondary | ICD-10-CM | POA: Diagnosis not present

## 2018-08-29 DIAGNOSIS — M6281 Muscle weakness (generalized): Secondary | ICD-10-CM | POA: Diagnosis not present

## 2018-08-29 DIAGNOSIS — R41841 Cognitive communication deficit: Secondary | ICD-10-CM | POA: Diagnosis not present

## 2018-08-29 DIAGNOSIS — Z8673 Personal history of transient ischemic attack (TIA), and cerebral infarction without residual deficits: Secondary | ICD-10-CM | POA: Diagnosis not present

## 2018-08-29 DIAGNOSIS — R269 Unspecified abnormalities of gait and mobility: Secondary | ICD-10-CM | POA: Diagnosis not present

## 2018-08-29 DIAGNOSIS — G309 Alzheimer's disease, unspecified: Secondary | ICD-10-CM | POA: Diagnosis not present

## 2018-09-03 DIAGNOSIS — R54 Age-related physical debility: Secondary | ICD-10-CM | POA: Diagnosis not present

## 2018-09-03 DIAGNOSIS — R269 Unspecified abnormalities of gait and mobility: Secondary | ICD-10-CM | POA: Diagnosis not present

## 2018-09-03 DIAGNOSIS — S2241XD Multiple fractures of ribs, right side, subsequent encounter for fracture with routine healing: Secondary | ICD-10-CM | POA: Diagnosis not present

## 2018-09-03 DIAGNOSIS — M6281 Muscle weakness (generalized): Secondary | ICD-10-CM | POA: Diagnosis not present

## 2018-09-03 DIAGNOSIS — R41841 Cognitive communication deficit: Secondary | ICD-10-CM | POA: Diagnosis not present

## 2018-09-03 DIAGNOSIS — R279 Unspecified lack of coordination: Secondary | ICD-10-CM | POA: Diagnosis not present

## 2018-09-03 DIAGNOSIS — G309 Alzheimer's disease, unspecified: Secondary | ICD-10-CM | POA: Diagnosis not present

## 2018-09-03 DIAGNOSIS — Z8673 Personal history of transient ischemic attack (TIA), and cerebral infarction without residual deficits: Secondary | ICD-10-CM | POA: Diagnosis not present

## 2018-09-14 DIAGNOSIS — G309 Alzheimer's disease, unspecified: Secondary | ICD-10-CM | POA: Diagnosis not present

## 2018-09-14 DIAGNOSIS — S2241XA Multiple fractures of ribs, right side, initial encounter for closed fracture: Secondary | ICD-10-CM | POA: Diagnosis not present

## 2018-09-14 DIAGNOSIS — R269 Unspecified abnormalities of gait and mobility: Secondary | ICD-10-CM | POA: Diagnosis not present

## 2018-09-14 DIAGNOSIS — I5022 Chronic systolic (congestive) heart failure: Secondary | ICD-10-CM | POA: Diagnosis not present

## 2018-10-19 DIAGNOSIS — G309 Alzheimer's disease, unspecified: Secondary | ICD-10-CM | POA: Diagnosis not present

## 2018-10-19 DIAGNOSIS — R269 Unspecified abnormalities of gait and mobility: Secondary | ICD-10-CM | POA: Diagnosis not present

## 2018-10-19 DIAGNOSIS — I5022 Chronic systolic (congestive) heart failure: Secondary | ICD-10-CM | POA: Diagnosis not present

## 2018-10-19 DIAGNOSIS — F339 Major depressive disorder, recurrent, unspecified: Secondary | ICD-10-CM | POA: Diagnosis not present

## 2018-10-19 DIAGNOSIS — I10 Essential (primary) hypertension: Secondary | ICD-10-CM | POA: Diagnosis not present

## 2021-09-29 DEATH — deceased
